# Patient Record
Sex: Female | Born: 1980 | Hispanic: Yes | Marital: Single | Smoking: Never smoker
Health system: Southern US, Community
[De-identification: ages and names within clinical notes are randomized; demographics above are authoritative.]

## PROBLEM LIST (undated history)

## (undated) DIAGNOSIS — N915 Oligomenorrhea, unspecified: Secondary | ICD-10-CM

## (undated) DIAGNOSIS — D649 Anemia, unspecified: Secondary | ICD-10-CM

## (undated) DIAGNOSIS — N920 Excessive and frequent menstruation with regular cycle: Secondary | ICD-10-CM

---

## 2008-01-04 ENCOUNTER — Emergency Department: Payer: Self-pay | Admitting: Emergency Medicine

## 2008-03-23 ENCOUNTER — Ambulatory Visit: Payer: Self-pay | Admitting: Family Medicine

## 2008-03-23 ENCOUNTER — Inpatient Hospital Stay: Payer: Self-pay

## 2008-10-29 ENCOUNTER — Ambulatory Visit: Payer: Self-pay | Admitting: Family Medicine

## 2019-03-17 ENCOUNTER — Other Ambulatory Visit: Payer: Self-pay | Admitting: General Practice

## 2019-03-17 DIAGNOSIS — Z20822 Contact with and (suspected) exposure to covid-19: Secondary | ICD-10-CM

## 2019-03-18 LAB — NOVEL CORONAVIRUS, NAA: SARS-CoV-2, NAA: DETECTED — AB

## 2020-01-13 ENCOUNTER — Other Ambulatory Visit: Payer: Self-pay

## 2020-01-13 ENCOUNTER — Emergency Department
Admission: EM | Admit: 2020-01-13 | Discharge: 2020-01-13 | Disposition: A | Discharge status: Home / Self Care | Payer: Self-pay | Attending: Emergency Medicine | Admitting: Emergency Medicine

## 2020-01-13 DIAGNOSIS — R531 Weakness: Secondary | ICD-10-CM | POA: Insufficient documentation

## 2020-01-13 DIAGNOSIS — D649 Anemia, unspecified: Secondary | ICD-10-CM | POA: Insufficient documentation

## 2020-01-13 HISTORY — DX: Excessive and frequent menstruation with regular cycle: N92.0

## 2020-01-13 HISTORY — DX: Anemia, unspecified: D64.9

## 2020-01-13 HISTORY — DX: Oligomenorrhea, unspecified: N91.5

## 2020-01-13 LAB — CBC
HCT: 22.6 % — ABNORMAL LOW (ref 36.0–46.0)
Hemoglobin: 6.6 g/dL — ABNORMAL LOW (ref 12.0–15.0)
MCH: 19 pg — ABNORMAL LOW (ref 26.0–34.0)
MCHC: 29.2 g/dL — ABNORMAL LOW (ref 30.0–36.0)
MCV: 64.9 fL — ABNORMAL LOW (ref 80.0–100.0)
Platelets: 364 10*3/uL (ref 150–400)
RBC: 3.48 MIL/uL — ABNORMAL LOW (ref 3.87–5.11)
RDW: 20.7 % — ABNORMAL HIGH (ref 11.5–15.5)
WBC: 5.2 10*3/uL (ref 4.0–10.5)
nRBC: 0 % (ref 0.0–0.2)

## 2020-01-13 LAB — TROPONIN I (HIGH SENSITIVITY)
Troponin I (High Sensitivity): <2 ng/L (ref <18)
Troponin I (High Sensitivity): 3 ng/L (ref <18)

## 2020-01-13 LAB — BASIC METABOLIC PANEL
Anion gap: 6 (ref 5–15)
BUN: 11 mg/dL (ref 6–20)
CO2: 26 mmol/L (ref 22–32)
Calcium: 8.7 mg/dL — ABNORMAL LOW (ref 8.9–10.3)
Chloride: 102 mmol/L (ref 98–111)
Creatinine, Ser: 0.42 mg/dL — ABNORMAL LOW (ref 0.44–1.00)
GFR calc non Af Amer: >60 mL/min (ref >60)
Glucose, Bld: 104 mg/dL — ABNORMAL HIGH (ref 70–99)
Potassium: 3.9 mmol/L (ref 3.5–5.1)
Sodium: 134 mmol/L — ABNORMAL LOW (ref 135–145)

## 2020-01-13 LAB — ABO/RH: ABO/RH(D): O POS

## 2020-01-13 LAB — POCT PREGNANCY, URINE: Preg Test, Ur: NEGATIVE

## 2020-01-13 LAB — PREPARE RBC (CROSSMATCH)

## 2020-01-13 LAB — GLUCOSE, CAPILLARY: Glucose-Capillary: 95 mg/dL (ref 70–99)

## 2020-01-13 MED ORDER — SODIUM CHLORIDE 0.9 % IV SOLN
10.0000 mL/h | Freq: Once | INTRAVENOUS | Status: AC
  Administered 2020-01-13: 10 mL/h via INTRAVENOUS

## 2020-01-13 NOTE — ED Notes (Signed)
Spanish interpretor requested

## 2020-01-13 NOTE — ED Triage Notes (Signed)
Pt to the er for weakness, dizziness, excessive bleeding with periods x 1.5 years. PCP sent pt here for HGB of 6.3. Pt has pain in her chest when she walks. Pt is dizzy in triage.

## 2020-01-13 NOTE — ED Provider Notes (Signed)
[facility] Emergency Department Provider Note  ____________________________________________   I have reviewed the triage vital signs and the nursing notes.   HISTORY  Chief Complaint Anemia  History limited by: Language [facility] Interpreter utilized   HPI Leah Glover is a 39 y.o. female who presents to the emergency department today as instructed by her physician because of concerns for anemia.  Patient states that she has a history of heavy and irregular menstrual periods.  She states she has a history of anemia and had been ordered for transfusion roughly 8 months ago but did not undergo that secondary to Covid concerns.  Patient states that for the past 3 weeks she has felt increasingly fatigued.  She states it is now hard for her to walk.  She states at the time my exam she is not currently menstruating or having any bleeding.  Records reviewed. Per medical record review patient has a history of anemia. Menorrhagia.   Past Medical History:  Diagnosis Date  . Anemia   . Menorrhagia   . Oligomenorrhea     There are no problems to display for this patient.   History reviewed. No pertinent surgical history.  Prior to Admission medications   Not on File    Allergies Patient has no known allergies.  No family history on file.  Social History Social History   Tobacco Use  . Smoking status: Never Smoker  . Smokeless tobacco: Never Used  Substance Use Topics  . Alcohol use: Never  . Drug use: Never    Review of Systems Constitutional: Positive for generalized weakness.  Eyes: No visual changes. ENT: No sore throat. Cardiovascular: Denies chest pain. Respiratory: Denies shortness of breath. Gastrointestinal: No abdominal pain.  No nausea, no vomiting.  No diarrhea.   Genitourinary: Positive for abnormal bleeding.  Musculoskeletal: Negative for back pain. Skin: Negative for rash. Neurological: Negative for headaches,  focal weakness or numbness.  ____________________________________________   PHYSICAL EXAM:  VITAL SIGNS: ED Triage Vitals  Enc Vitals Group     BP 01/13/20 1552 (!) 109/35     Pulse Rate 01/13/20 1552 79     Resp 01/13/20 1552 16     Temp 01/13/20 1552 98.4 F (36.9 C)     Temp Source 01/13/20 1552 Oral     SpO2 01/13/20 1552 100 %     Weight 01/13/20 1605 158 lb (71.7 kg)     Height 01/13/20 1605 5\' 2"  (1.575 m)     Head Circumference --      Peak Flow --      Pain Score 01/13/20 1559 7   Constitutional: Alert and oriented.  Eyes: Conjunctivae are normal.  ENT      Head: Normocephalic and atraumatic.      Nose: No congestion/rhinnorhea.      Mouth/Throat: Mucous membranes are moist.      Neck: No stridor. Hematological/Lymphatic/Immunilogical: No cervical lymphadenopathy. Cardiovascular: Normal rate, regular rhythm.  No murmurs, rubs, or gallops.  Respiratory: Normal respiratory effort without tachypnea nor retractions. Breath sounds are clear and equal bilaterally. No wheezes/rales/rhonchi. Gastrointestinal: Soft and non tender. No rebound. No guarding.  Genitourinary: Deferred Musculoskeletal: Normal range of motion in all extremities. No lower extremity edema. Neurologic:  Normal speech and language. No gross focal neurologic deficits are appreciated.  Skin:  Skin is warm, dry and intact. No rash noted. Psychiatric: Mood and affect are normal. Speech and behavior are normal. Patient exhibits appropriate insight and judgment.  ____________________________________________  LABS (pertinent positives/negatives)  BMP na 134, k 3.9, glu 104, cr 0.42 CBC wbc 5.2, hgb 6.6, plt 364 Trop hs <2  ____________________________________________   EKG  I, Phineas Semen, attending physician, personally viewed and interpreted this EKG  EKG Time: 1611 Rate: 68 Rhythm: normal sinus rhythm Axis: normal Intervals: qtc 406 QRS: narrow ST changes: no st  elevation Impression: Normal ekg   ____________________________________________    RADIOLOGY  None  ____________________________________________   PROCEDURES  Procedures  ____________________________________________   INITIAL IMPRESSION / ASSESSMENT AND PLAN / ED COURSE  Pertinent labs & imaging results that were available during my care of the patient were reviewed by me and considered in my medical decision making (see chart for details).   Patient presented to the emergency department today because of concern for anemia. Patient describes heavy menses and chronic history of anemia. Hgb 6.6 here. Patient denies any current bleeding. Patient was given one unit of pRBCs with improvement in her symptoms. Discussed with patient importance of follow up with ob/gyn and her pcp.   ___________________________________________   FINAL CLINICAL IMPRESSION(S) / ED DIAGNOSES  Final diagnoses:  Weakness  Anemia, unspecified type     Note: This dictation was prepared with Dragon dictation. Any transcriptional errors that result from this process are unintentional     Phineas Semen, MD 01/13/20 2302

## 2020-01-13 NOTE — ED Notes (Signed)
Pt resting, no complaints. Daughter at bedside.

## 2020-01-13 NOTE — Discharge Instructions (Addendum)
Please seek medical attention for any high fevers, chest pain, shortness of breath, change in behavior, persistent vomiting, bloody stool or any other new or concerning symptoms.  

## 2020-01-14 LAB — TYPE AND SCREEN
ABO/RH(D): O POS
Antibody Screen: NEGATIVE
Unit division: 0

## 2020-01-14 LAB — BPAM RBC
Blood Product Expiration Date: 202110312359
ISSUE DATE / TIME: 202110061917
Unit Type and Rh: 5100

## 2020-04-15 ENCOUNTER — Encounter: Payer: Self-pay | Admitting: Emergency Medicine

## 2020-04-15 ENCOUNTER — Emergency Department
Admission: EM | Admit: 2020-04-15 | Discharge: 2020-04-15 | Disposition: A | Discharge status: Home / Self Care | Payer: Self-pay | Attending: Emergency Medicine | Admitting: Emergency Medicine

## 2020-04-15 ENCOUNTER — Other Ambulatory Visit: Payer: Self-pay

## 2020-04-15 ENCOUNTER — Emergency Department: Payer: Self-pay

## 2020-04-15 DIAGNOSIS — N939 Abnormal uterine and vaginal bleeding, unspecified: Secondary | ICD-10-CM | POA: Insufficient documentation

## 2020-04-15 DIAGNOSIS — R102 Pelvic and perineal pain: Secondary | ICD-10-CM | POA: Insufficient documentation

## 2020-04-15 DIAGNOSIS — Z8742 Personal history of other diseases of the female genital tract: Secondary | ICD-10-CM | POA: Insufficient documentation

## 2020-04-15 LAB — URINALYSIS, COMPLETE (UACMP) WITH MICROSCOPIC
Bacteria, UA: NONE SEEN
Bilirubin Urine: NEGATIVE
Glucose, UA: NEGATIVE mg/dL
Ketones, ur: NEGATIVE mg/dL
Leukocytes,Ua: NEGATIVE
Nitrite: NEGATIVE
Protein, ur: 30 mg/dL — AB
RBC / HPF: >50 RBC/hpf — ABNORMAL HIGH (ref 0–5)
Specific Gravity, Urine: 1.019 (ref 1.005–1.030)
Squamous Epithelial / HPF: NONE SEEN (ref 0–5)
pH: 5 (ref 5.0–8.0)

## 2020-04-15 LAB — HCG, QUANTITATIVE, PREGNANCY: hCG, Beta Chain, Quant, S: 1 m[IU]/mL (ref <5)

## 2020-04-15 LAB — COMPREHENSIVE METABOLIC PANEL
ALT: 26 U/L (ref 0–44)
AST: 28 U/L (ref 15–41)
Albumin: 4.1 g/dL (ref 3.5–5.0)
Alkaline Phosphatase: 102 U/L (ref 38–126)
Anion gap: 6 (ref 5–15)
BUN: 14 mg/dL (ref 6–20)
CO2: 23 mmol/L (ref 22–32)
Calcium: 9.4 mg/dL (ref 8.9–10.3)
Chloride: 108 mmol/L (ref 98–111)
Creatinine, Ser: 0.55 mg/dL (ref 0.44–1.00)
GFR, Estimated: >60 mL/min (ref >60)
Glucose, Bld: 129 mg/dL — ABNORMAL HIGH (ref 70–99)
Potassium: 3.9 mmol/L (ref 3.5–5.1)
Sodium: 137 mmol/L (ref 135–145)
Total Bilirubin: 0.5 mg/dL (ref 0.3–1.2)
Total Protein: 8.7 g/dL — ABNORMAL HIGH (ref 6.5–8.1)

## 2020-04-15 LAB — CBC WITH DIFFERENTIAL/PLATELET
Abs Immature Granulocytes: 0.03 10*3/uL (ref 0.00–0.07)
Basophils Absolute: 0 10*3/uL (ref 0.0–0.1)
Basophils Relative: 0 %
Eosinophils Absolute: 0 10*3/uL (ref 0.0–0.5)
Eosinophils Relative: 0 %
HCT: 31.2 % — ABNORMAL LOW (ref 36.0–46.0)
Hemoglobin: 9.2 g/dL — ABNORMAL LOW (ref 12.0–15.0)
Immature Granulocytes: 0 %
Lymphocytes Relative: 9 %
Lymphs Abs: 1 10*3/uL (ref 0.7–4.0)
MCH: 20.4 pg — ABNORMAL LOW (ref 26.0–34.0)
MCHC: 29.5 g/dL — ABNORMAL LOW (ref 30.0–36.0)
MCV: 69.2 fL — ABNORMAL LOW (ref 80.0–100.0)
Monocytes Absolute: 0.6 10*3/uL (ref 0.1–1.0)
Monocytes Relative: 5 %
Neutro Abs: 9.5 10*3/uL — ABNORMAL HIGH (ref 1.7–7.7)
Neutrophils Relative %: 86 %
Platelets: 350 10*3/uL (ref 150–400)
RBC: 4.51 MIL/uL (ref 3.87–5.11)
RDW: 25.4 % — ABNORMAL HIGH (ref 11.5–15.5)
Smear Review: NORMAL
WBC: 11.2 10*3/uL — ABNORMAL HIGH (ref 4.0–10.5)
nRBC: 0 % (ref 0.0–0.2)

## 2020-04-15 LAB — PROTIME-INR
INR: 1 (ref 0.8–1.2)
Prothrombin Time: 12.6 seconds (ref 11.4–15.2)

## 2020-04-15 LAB — TYPE AND SCREEN
ABO/RH(D): O POS
Antibody Screen: NEGATIVE

## 2020-04-15 MED ORDER — FENTANYL CITRATE (PF) 100 MCG/2ML IJ SOLN
50.0000 ug | Freq: Once | INTRAMUSCULAR | Status: AC
  Administered 2020-04-15: 50 ug via INTRAVENOUS
  Filled 2020-04-15: qty 2

## 2020-04-15 MED ORDER — MEDROXYPROGESTERONE ACETATE 10 MG PO TABS: 20.0000 mg | ORAL_TABLET | Freq: Three times a day (TID) | ORAL | 0 refills | Status: DC

## 2020-04-15 MED ORDER — ACETAMINOPHEN 500 MG PO TABS
1000.0000 mg | ORAL_TABLET | Freq: Once | ORAL | Status: AC
  Administered 2020-04-15: 1000 mg via ORAL

## 2020-04-15 NOTE — Discharge Instructions (Addendum)
Results for orders placed or performed during the hospital encounter of 04/15/20  CBC with Differential  Result Value Ref Range   WBC 11.2 (H) 4.0 - 10.5 K/uL   RBC 4.51 3.87 - 5.11 MIL/uL   Hemoglobin 9.2 (L) 12.0 - 15.0 g/dL   HCT 00.8 (L) 67.6 - 19.5 %   MCV 69.2 (L) 80.0 - 100.0 fL   MCH 20.4 (L) 26.0 - 34.0 pg   MCHC 29.5 (L) 30.0 - 36.0 g/dL   RDW 09.3 (H) 26.7 - 12.4 %   Platelets 350 150 - 400 K/uL   nRBC 0.0 0.0 - 0.2 %   Neutrophils Relative % 86 %   Neutro Abs 9.5 (H) 1.7 - 7.7 K/uL   Lymphocytes Relative 9 %   Lymphs Abs 1.0 0.7 - 4.0 K/uL   Monocytes Relative 5 %   Monocytes Absolute 0.6 0.1 - 1.0 K/uL   Eosinophils Relative 0 %   Eosinophils Absolute 0.0 0.0 - 0.5 K/uL   Basophils Relative 0 %   Basophils Absolute 0.0 0.0 - 0.1 K/uL   WBC Morphology MORPHOLOGY UNREMARKABLE    Smear Review Normal platelet morphology    Immature Granulocytes 0 %   Abs Immature Granulocytes 0.03 0.00 - 0.07 K/uL   Polychromasia PRESENT   Comprehensive metabolic panel  Result Value Ref Range   Sodium 137 135 - 145 mmol/L   Potassium 3.9 3.5 - 5.1 mmol/L   Chloride 108 98 - 111 mmol/L   CO2 23 22 - 32 mmol/L   Glucose, Bld 129 (H) 70 - 99 mg/dL   BUN 14 6 - 20 mg/dL   Creatinine, Ser 5.80 0.44 - 1.00 mg/dL   Calcium 9.4 8.9 - 99.8 mg/dL   Total Protein 8.7 (H) 6.5 - 8.1 g/dL   Albumin 4.1 3.5 - 5.0 g/dL   AST 28 15 - 41 U/L   ALT 26 0 - 44 U/L   Alkaline Phosphatase 102 38 - 126 U/L   Total Bilirubin 0.5 0.3 - 1.2 mg/dL   GFR, Estimated >33 >82 mL/min   Anion gap 6 5 - 15  Urinalysis, Complete w Microscopic  Result Value Ref Range   Color, Urine YELLOW (A) YELLOW   APPearance CLOUDY (A) CLEAR   Specific Gravity, Urine 1.019 1.005 - 1.030   pH 5.0 5.0 - 8.0   Glucose, UA NEGATIVE NEGATIVE mg/dL   Hgb urine dipstick LARGE (A) NEGATIVE   Bilirubin Urine NEGATIVE NEGATIVE   Ketones, ur NEGATIVE NEGATIVE mg/dL   Protein, ur 30 (A) NEGATIVE mg/dL   Nitrite NEGATIVE NEGATIVE    Leukocytes,Ua NEGATIVE NEGATIVE   RBC / HPF >50 (H) 0 - 5 RBC/hpf   WBC, UA 11-20 0 - 5 WBC/hpf   Bacteria, UA NONE SEEN NONE SEEN   Squamous Epithelial / LPF NONE SEEN 0 - 5   Mucus PRESENT   Protime-INR  Result Value Ref Range   Prothrombin Time 12.6 11.4 - 15.2 seconds   INR 1.0 0.8 - 1.2  hCG, quantitative, pregnancy  Result Value Ref Range   hCG, Beta Chain, Quant, S 1 <5 mIU/mL  Type and screen [facility]  Result Value Ref Range   ABO/RH(D) O POS    Antibody Screen NEG    Sample Expiration      04/18/2020,2359 Performed at [facility], [address] [place], [place] [address]    US PELVIC COMPLETE W TRANSVAGINAL AND TORSION R/O  Result Date: 04/15/2020 CLINICAL DATA:  Pelvic pain. Vaginal bleeding for the past 2 weeks. Scheduled for surgery later this month for an endometrial polyp. EXAM: TRANSABDOMINAL AND TRANSVAGINAL ULTRASOUND OF PELVIS DOPPLER ULTRASOUND OF OVARIES TECHNIQUE: Both transabdominal and transvaginal ultrasound examinations of the pelvis were performed. Transabdominal technique was performed for global imaging of the pelvis including uterus, ovaries, adnexal regions, and pelvic cul-de-sac. It was necessary to proceed with endovaginal exam following the transabdominal exam to visualize the ovaries. Color and duplex Doppler ultrasound was utilized to evaluate blood flow to the ovaries. COMPARISON:  OB ultrasound dated 03/23/2008. FINDINGS: Uterus Measurements: 12.2 x 9.0 x 7.5 cm = volume: 431 mL. No fibroids or other mass visualized. Endometrium Thickness: 33.9 mm. Diffusely thickened and heterogeneous with a poorly defined eccentric mass posteriorly, measuring approximately 3.4 x 3.3 x 3.2 cm. This has mildly prominent internal blood flow with color Doppler. Right ovary Not visualized Left ovary Measurements: 2.9 x 2.1 x 2.1 cm = volume: 6.9 mL. Normal appearance/no adnexal mass. Pulsed Doppler evaluation of the left ovary  demonstrates normal low-resistance arterial and venous waveforms. Other findings No abnormal free fluid. IMPRESSION: 1. Approximately 3.4 x 3.3 x 3.2 cm eccentric mass in the endometrium posteriorly, most likely representing the patient's known endometrial polyp. 2. Nonvisualized right ovary. 3. Normal appearing left ovary without evidence of torsion. Electronically Signed   By: Beckie Salts M.D.   On: 04/15/2020 12:16

## 2020-04-15 NOTE — ED Notes (Signed)
Report to John, RN

## 2020-04-15 NOTE — ED Provider Notes (Signed)
[facility] Emergency Department Provider Note  ____________________________________________   Event Date/Time   First MD Initiated Contact with Patient 04/15/20 [phone]     (approximate)  I have reviewed the triage vital signs and the nursing notes.   HISTORY  Chief Complaint Vaginal Bleeding   HPI Leah Glover is a 40 y.o. female G3P3 with a past medical history of menorrhagia and known uterine polyps who presents for assessment of acute onset of bilateral lower pelvic pain that started around 4 AM today.  Patient states she has been spotting the last couple days but is not bleeding anymore than usual.  He states she has had bleeding with her polyps in the past but never this type of pain.  She denies any other acute sick symptoms including other vaginal discharge, burning with urination nausea, vomiting, diarrhea, back pain, chest pain, cough, shortness of breath, or other acute sick symptoms.  No prior similar episodes.  No clear alleviating aggravating factors.         Past Medical History:  Diagnosis Date  . Anemia   . Menorrhagia   . Oligomenorrhea     There are no problems to display for this patient.   History reviewed. No pertinent surgical history.  Prior to Admission medications   Not on File    Allergies Patient has no known allergies.  No family history on file.  Social History Social History   Tobacco Use  . Smoking status: Never Smoker  . Smokeless tobacco: Never Used  Vaping Use  . Vaping Use: Never used  Substance Use Topics  . Alcohol use: Never  . Drug use: Never    Review of Systems  Review of Systems  Constitutional: Negative for chills and fever.  HENT: Negative for sore throat.   Eyes: Negative for pain.  Respiratory: Negative for cough and stridor.   Cardiovascular: Negative for chest pain.  Gastrointestinal: Positive for abdominal pain. Negative for vomiting.  Genitourinary: Negative for dysuria.   Musculoskeletal: Negative for myalgias.  Skin: Negative for rash.  Neurological: Negative for seizures, loss of consciousness and headaches.  Psychiatric/Behavioral: Negative for suicidal ideas.  All other systems reviewed and are negative.     ____________________________________________   PHYSICAL EXAM:  VITAL SIGNS: ED Triage Vitals  Enc Vitals Group     BP 04/15/20 0534 128/90     Pulse Rate 04/15/20 0534 61     Resp 04/15/20 0534 20     Temp 04/15/20 0534 98.4 F (36.9 C)     Temp Source 04/15/20 0534 Oral     SpO2 04/15/20 0534 100 %     Weight 04/15/20 0535 160 lb (72.6 kg)     Height 04/15/20 0535 5\' 2"  (1.575 m)     Head Circumference --      Peak Flow --      Pain Score 04/15/20 0539 10     Pain Loc --      Pain Edu? --      Excl. in GC? --    Vitals:   04/15/20 0534  BP: 128/90  Pulse: 61  Resp: 20  Temp: 98.4 F (36.9 C)  SpO2: 100%   Physical Exam Vitals and nursing note reviewed. Exam conducted with a chaperone present.  Constitutional:      General: She is in acute distress.     Appearance: She is well-developed and well-nourished.  HENT:     Head: Normocephalic and atraumatic.     Right  Ear: External ear normal.     Left Ear: External ear normal.     Nose: Nose normal.  Eyes:     Conjunctiva/sclera: Conjunctivae normal.  Cardiovascular:     Rate and Rhythm: Normal rate and regular rhythm.     Heart sounds: No murmur heard.   Pulmonary:     Effort: Pulmonary effort is normal. No respiratory distress.     Breath sounds: Normal breath sounds.  Abdominal:     Palpations: Abdomen is soft.     Tenderness: There is no abdominal tenderness.  Genitourinary:    Cervix: Cervical bleeding ( from closed external os) present. No cervical motion tenderness, friability or lesion.  Musculoskeletal:        General: No edema.     Cervical back: Neck supple.  Skin:    General: Skin is warm and dry.  Neurological:     Mental Status: She is alert  and oriented to person, place, and time.  Psychiatric:        Mood and Affect: Mood and affect and mood normal.      ____________________________________________   LABS (all labs ordered are listed, but only abnormal results are displayed)  Labs Reviewed  CBC WITH DIFFERENTIAL/PLATELET  COMPREHENSIVE METABOLIC PANEL  URINALYSIS, COMPLETE (UACMP) WITH MICROSCOPIC  PROTIME-INR  POC URINE PREG, ED  TYPE AND SCREEN   ____________________________________________  ____________________________________________  RADIOLOGY  ED MD interpretation:   Official radiology report(s): No results found.  ____________________________________________   PROCEDURES  Procedure(s) performed (including Critical Care):  Procedures   ____________________________________________   INITIAL IMPRESSION / ASSESSMENT AND PLAN / ED COURSE      Patient presents with above to history exam for assessment of acute onset of pelvic pain associated with passage of some tissue from her vagina and vaginal bleeding in the setting of several days of spotting and history of metrorrhagia and anemia requiring transfusions.  On arrival patient is afebrile hemodynamically stable.  On exam she does have closed os with some scant bleeding but does have what appears to be endometrial tissue in her underwear.  She does appear to be in acute distress and below noted analgesia was given.  Differential includes but is not limited to sloughing of endometrial tissue possibly containing polyp versus other etiologies of dysfunctional uterine bleeding.  Given passage of tissue and bleeding and lower suspicion for torsion although we will also assess for ovarian size and blood flow with pelvic ultrasound.  We will check basic labs and urine as well.  No findings on exam to suggest PID.  No GI symptoms to suggest diverticulitis and has no tenderness above the umbilicus or other historical or exam findings suggest acute  cholecystitis, pancreatitis, or appendicitis.    Care patient signed over to oncoming provider at approximately 0 700.  Plan is to follow-up labs and ultrasound.  If these are normal and patient continues have normal vital signs and appears to be more comfortable on reassessment of late she is really safe for discharge with plan for close outpatient gynecology follow-up.        ____________________________________________   FINAL CLINICAL IMPRESSION(S) / ED DIAGNOSES  Final diagnoses:  Pelvic pain    Medications  acetaminophen (TYLENOL) tablet 1,000 mg (has no administration in time range)  fentaNYL (SUBLIMAZE) injection 50 mcg (has no administration in time range)     ED Discharge Orders    None       Note:  This document was prepared using Dragon voice  recognition software and may include unintentional dictation errors.   Gilles Chiquito, MD 04/15/20 [phone]

## 2020-04-15 NOTE — ED Triage Notes (Addendum)
Pt to triage via w/c, appears very uncomfortable, pale; husband st pt is sched for surgery later this month for uterine polyps but pt awoke PTA with severe pain, bleeding, and swelling

## 2020-04-15 NOTE — ED Notes (Signed)
Pt resting quietly at this time. NAD noted. Husband at bedside. Call bell in reach. Pt denies any further needs at this time.

## 2020-04-15 NOTE — ED Provider Notes (Signed)
Procedures     ----------------------------------------- 2:43 PM on 04/15/2020 -----------------------------------------  Vital signs remained stable.  Pain is decreased to 3/10, intermittent.  Ultrasound shows no acute findings, redemonstrates known uterine polyp.  She reports that she has been following up with her PCP but not gynecology yet despite having irregular bleeding for the past 2 years.  Discussed findings with the patient and her spouse at bedside, recommend follow-up with gynecology, course of Provera tablets for menorrhagia.    Sharman Cheek, MD 04/15/20 1444

## 2020-04-15 NOTE — ED Notes (Signed)
Pt assisted to bedside toilet and educated on need for UA. Pt and family educated for the need to move pt to a hallway, pt educated.

## 2020-04-15 NOTE — ED Notes (Addendum)
This RN went to move pt to hallway bed and husband started to argue with this RN that serum is not blood and that it was something that she needed through her IV, husband then started to state he didn't like my attitude and that I was wrong that "serum was NOT blood and it was needed through her IV", pt educated this was not true. 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 Pt and family updated on plan of care, husband still proceeded to argue with this RN. Charge aware.

## 2020-05-04 DIAGNOSIS — N939 Abnormal uterine and vaginal bleeding, unspecified: Secondary | ICD-10-CM | POA: Insufficient documentation

## 2021-10-29 IMAGING — US US PELVIS COMPLETE TRANSABD/TRANSVAG W DUPLEX
1 series · 13 of 25 positions shown · non-contrast
Comparison: OB ultrasound dated 03/23/2008.

CLINICAL DATA: Pelvic pain. Vaginal bleeding for the past 2 weeks.
Scheduled for surgery later this month for an endometrial polyp.

EXAM:
TRANSABDOMINAL AND TRANSVAGINAL ULTRASOUND OF PELVIS
DOPPLER ULTRASOUND OF OVARIES
TECHNIQUE: Both transabdominal and transvaginal ultrasound examinations of the
pelvis were performed. Transabdominal technique was performed for
global imaging of the pelvis including uterus, ovaries, adnexal
regions, and pelvic cul-de-sac.
It was necessary to proceed with endovaginal exam following the
transabdominal exam to visualize the ovaries. Color and duplex
Doppler ultrasound was utilized to evaluate blood flow to the
ovaries.

[Series 1: us pelvic complete w transvaginal and torsion righ · 13 of 96 slices shown]
[im 1/96]
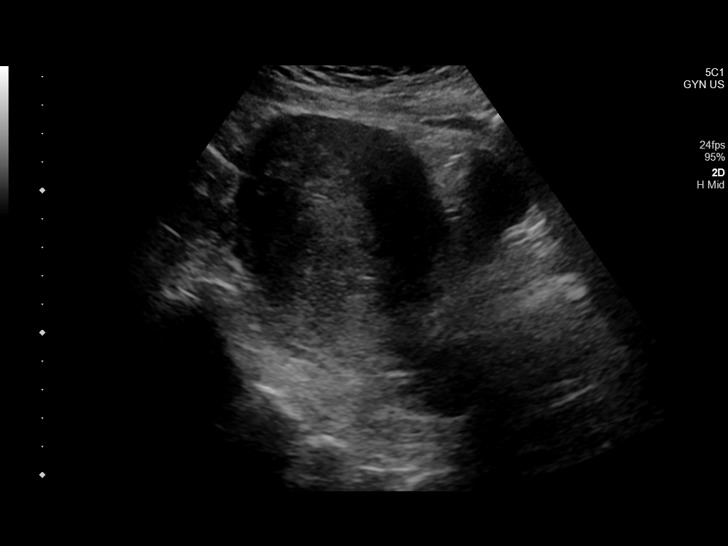
[im 8/96]
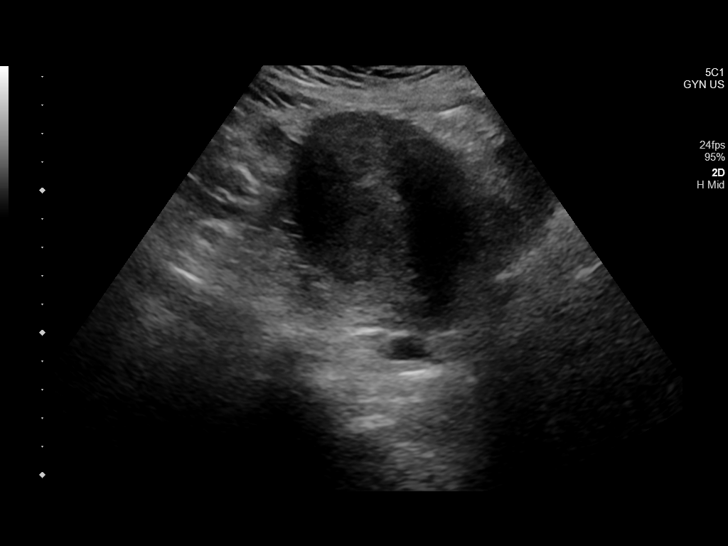
[im 16/96]
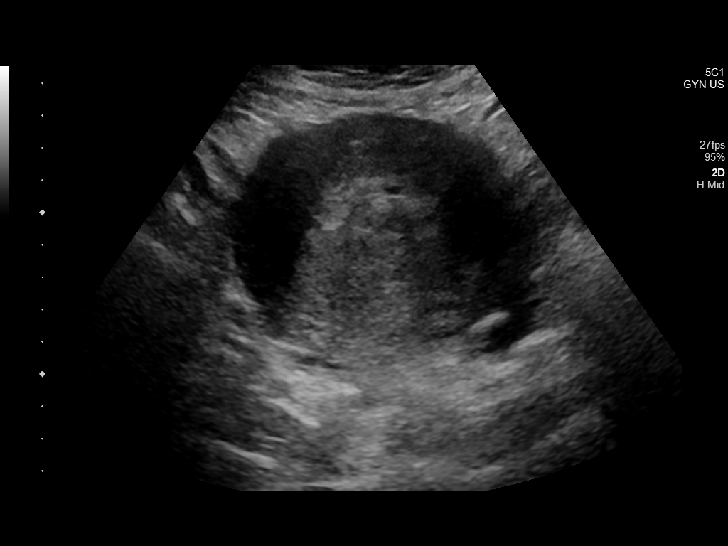
[im 24/96]
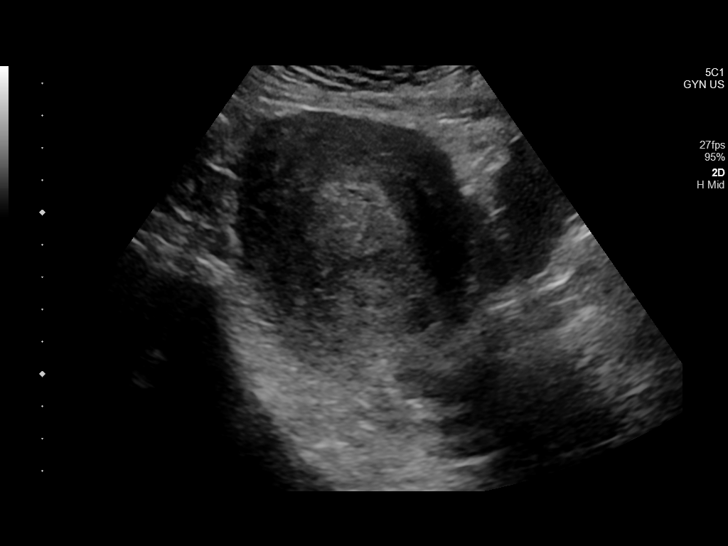
[im 32/96]
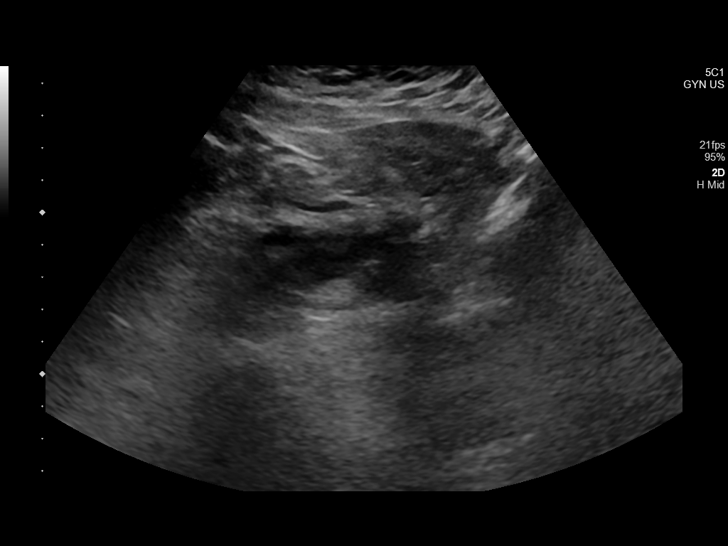
[im 40/96]
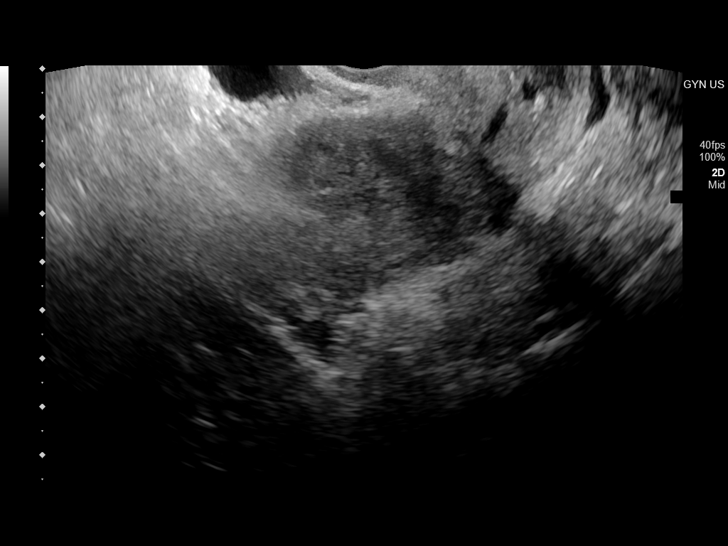
[im 48/96]
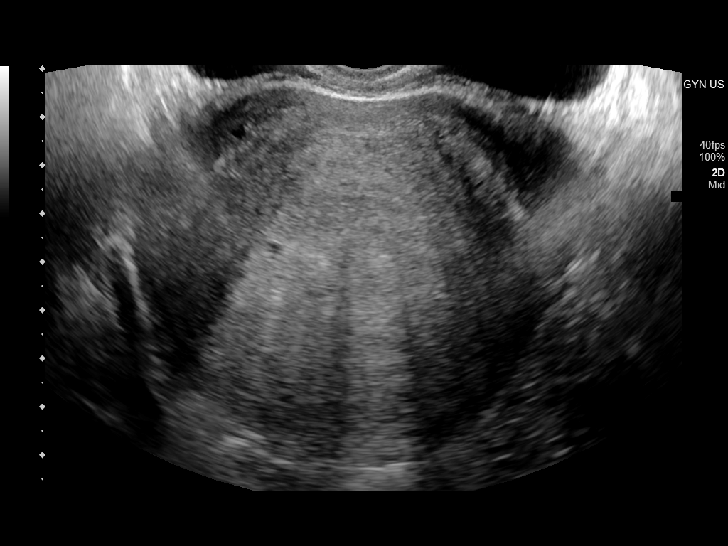
[im 56/96]
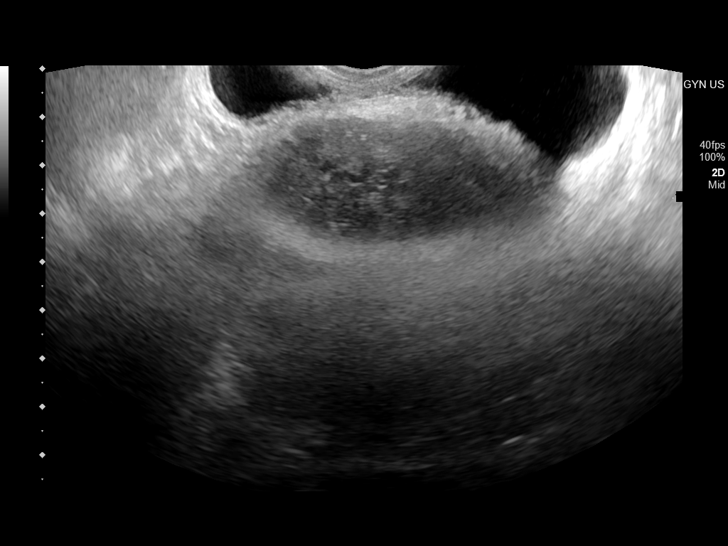
[im 64/96]
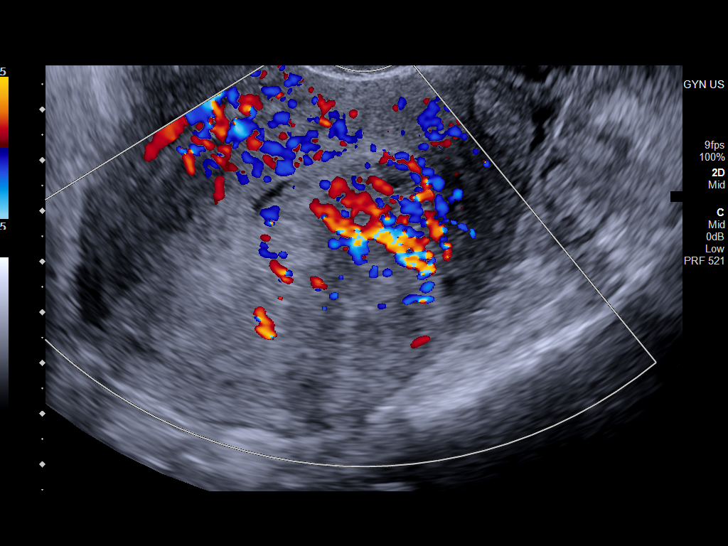
[im 72/96]
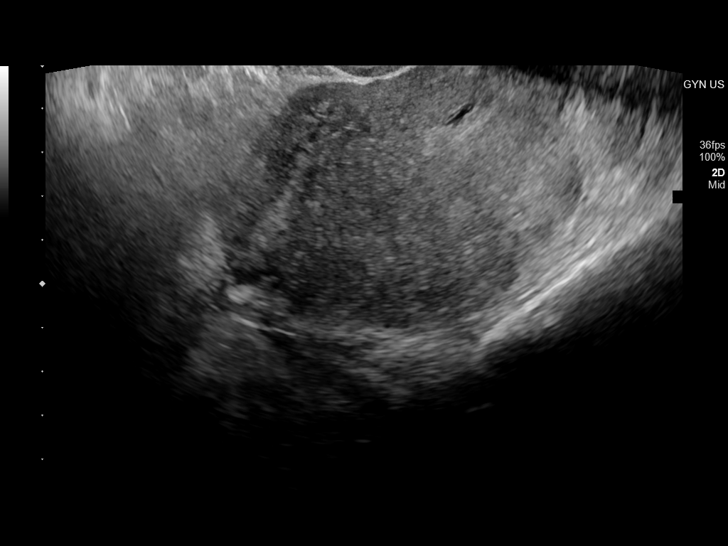
[im 80/96]
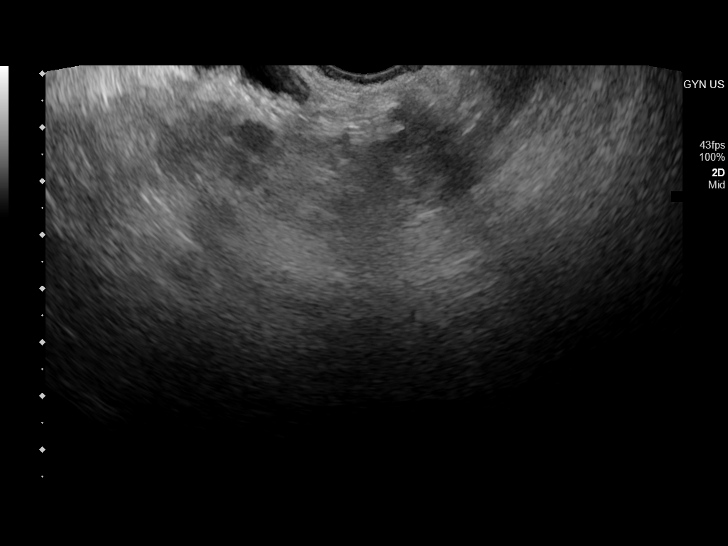
[im 88/96]
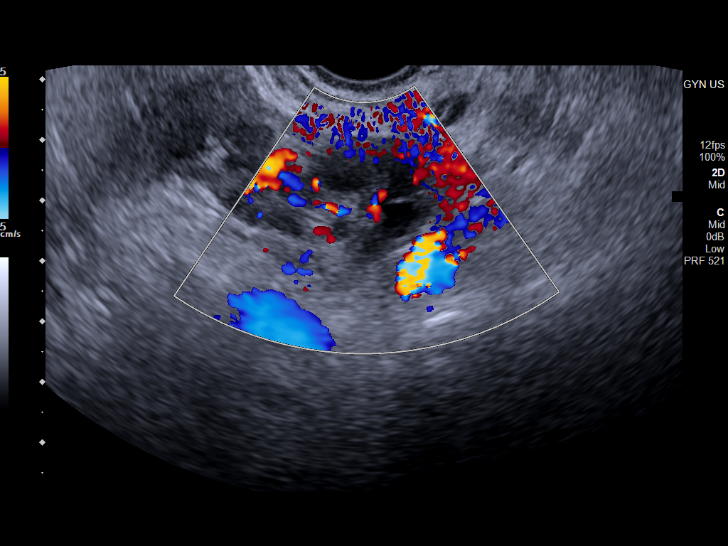
[im 96/96]
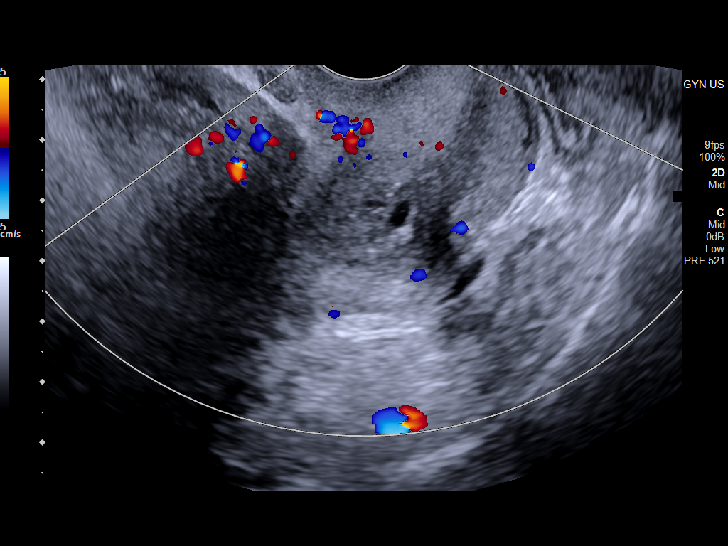

[13 of 25 positions shown; findings below may reference images not displayed]

FINDINGS: Uterus

Measurements: 12.2 x 9.0 x 7.5 cm = volume: 431 mL. No fibroids or
other mass visualized.

Endometrium

Thickness: 33.9 mm. Diffusely thickened and heterogeneous with a
poorly defined eccentric mass posteriorly, measuring approximately
3.4 x 3.3 x 3.2 cm. This has mildly prominent internal blood flow
with color Doppler.

Right ovary

Not visualized

Left ovary

Measurements: 2.9 x 2.1 x 2.1 cm = volume: 6.9 mL. Normal
appearance/no adnexal mass.

Pulsed Doppler evaluation of the left ovary demonstrates normal
low-resistance arterial and venous waveforms.

Other findings

No abnormal free fluid.
IMPRESSION: 1. Approximately 3.4 x 3.3 x 3.2 cm eccentric mass in the
endometrium posteriorly, most likely representing the patient's
known endometrial polyp.
2. Nonvisualized right ovary.
3. Normal appearing left ovary without evidence of torsion.

## 2022-07-21 ENCOUNTER — Observation Stay
Admission: EM | Admit: 2022-07-21 | Discharge: 2022-07-22 | Disposition: A | Discharge status: Home / Self Care | Payer: Self-pay | Attending: Obstetrics | Admitting: Obstetrics

## 2022-07-21 ENCOUNTER — Emergency Department: Payer: Self-pay

## 2022-07-21 ENCOUNTER — Other Ambulatory Visit: Payer: Self-pay

## 2022-07-21 DIAGNOSIS — D500 Iron deficiency anemia secondary to blood loss (chronic): Secondary | ICD-10-CM | POA: Insufficient documentation

## 2022-07-21 DIAGNOSIS — D649 Anemia, unspecified: Secondary | ICD-10-CM

## 2022-07-21 DIAGNOSIS — N920 Excessive and frequent menstruation with regular cycle: Secondary | ICD-10-CM

## 2022-07-21 DIAGNOSIS — Z23 Encounter for immunization: Secondary | ICD-10-CM | POA: Insufficient documentation

## 2022-07-21 DIAGNOSIS — N938 Other specified abnormal uterine and vaginal bleeding: Secondary | ICD-10-CM

## 2022-07-21 DIAGNOSIS — N939 Abnormal uterine and vaginal bleeding, unspecified: Principal | ICD-10-CM | POA: Insufficient documentation

## 2022-07-21 DIAGNOSIS — R7309 Other abnormal glucose: Secondary | ICD-10-CM | POA: Insufficient documentation

## 2022-07-21 DIAGNOSIS — D62 Acute posthemorrhagic anemia: Secondary | ICD-10-CM | POA: Insufficient documentation

## 2022-07-21 DIAGNOSIS — Z1152 Encounter for screening for COVID-19: Secondary | ICD-10-CM | POA: Insufficient documentation

## 2022-07-21 LAB — CBC
HCT: 25.4 % — ABNORMAL LOW (ref 36.0–46.0)
Hemoglobin: 6.5 g/dL — ABNORMAL LOW (ref 12.0–15.0)
MCH: 15.4 pg — ABNORMAL LOW (ref 26.0–34.0)
MCHC: 25.6 g/dL — ABNORMAL LOW (ref 30.0–36.0)
MCV: 60 fL — ABNORMAL LOW (ref 80.0–100.0)
Platelets: 459 10*3/uL — ABNORMAL HIGH (ref 150–400)
RBC: 4.23 MIL/uL (ref 3.87–5.11)
RDW: 22.2 % — ABNORMAL HIGH (ref 11.5–15.5)
WBC: 5.8 10*3/uL (ref 4.0–10.5)
nRBC: 0 % (ref 0.0–0.2)

## 2022-07-21 LAB — RESP PANEL BY RT-PCR (RSV, FLU A&B, COVID)  RVPGX2
Influenza A by PCR: NEGATIVE
Influenza B by PCR: NEGATIVE
Resp Syncytial Virus by PCR: NEGATIVE
SARS Coronavirus 2 by RT PCR: NEGATIVE

## 2022-07-21 LAB — BASIC METABOLIC PANEL
Anion gap: 4 — ABNORMAL LOW (ref 5–15)
BUN: 16 mg/dL (ref 6–20)
CO2: 25 mmol/L (ref 22–32)
Calcium: 8.4 mg/dL — ABNORMAL LOW (ref 8.9–10.3)
Chloride: 103 mmol/L (ref 98–111)
Creatinine, Ser: 0.47 mg/dL (ref 0.44–1.00)
GFR, Estimated: >60 mL/min (ref >60)
Glucose, Bld: 118 mg/dL — ABNORMAL HIGH (ref 70–99)
Potassium: 3.5 mmol/L (ref 3.5–5.1)
Sodium: 132 mmol/L — ABNORMAL LOW (ref 135–145)

## 2022-07-21 LAB — TYPE AND SCREEN
ABO/RH(D): O POS
Antibody Screen: NEGATIVE
Unit division: 0

## 2022-07-21 LAB — BPAM RBC
Blood Product Expiration Date: 202405202359
ISSUE DATE / TIME: 202404132009
Unit Type and Rh: 5100

## 2022-07-21 LAB — HCG, QUANTITATIVE, PREGNANCY: hCG, Beta Chain, Quant, S: <1 m[IU]/mL (ref <5)

## 2022-07-21 LAB — PREPARE RBC (CROSSMATCH)

## 2022-07-21 LAB — CBG MONITORING, ED: Glucose-Capillary: 110 mg/dL — ABNORMAL HIGH (ref 70–99)

## 2022-07-21 MED ORDER — OXYCODONE-ACETAMINOPHEN 5-325 MG PO TABS
1.0000 | ORAL_TABLET | Freq: Once | ORAL | Status: AC
  Administered 2022-07-21: 1 via ORAL
  Filled 2022-07-21: qty 1

## 2022-07-21 MED ORDER — BENZONATATE 100 MG PO CAPS
200.0000 mg | ORAL_CAPSULE | Freq: Once | ORAL | Status: AC
  Administered 2022-07-21: 200 mg via ORAL
  Filled 2022-07-21: qty 2

## 2022-07-21 MED ORDER — SODIUM CHLORIDE 0.9 % IV SOLN
10.0000 mL/h | Freq: Once | INTRAVENOUS | Status: AC
  Administered 2022-07-21: 10 mL/h via INTRAVENOUS

## 2022-07-21 MED ORDER — NORETHINDRONE ACETATE 5 MG PO TABS: 5.0000 mg | ORAL_TABLET | Freq: Two times a day (BID) | ORAL | 0 refills | Status: AC

## 2022-07-21 MED ORDER — ONDANSETRON 4 MG PO TBDP
4.0000 mg | ORAL_TABLET | Freq: Once | ORAL | Status: AC
  Administered 2022-07-21: 4 mg via ORAL
  Filled 2022-07-21: qty 1

## 2022-07-21 NOTE — Consult Note (Signed)
Consult History and Physical   SERVICE:   Patient Name: Leah Glover Patient MRN:   [id number]  CC: Heavy menstrual bleeding  HPI: Leah Glover is a 42 y.o. [id number] with symptomatic anemia. She reports that for the past few years, her menstrual bleeding has increased significantly. Her bleeding lasts 1-2 weeks and she has to change her pad 8-10x a day. She has quarter-sized clots. She reports 3 uncomplicated pregnancies with vaginal births. She denies a h/o of fibroids, uterine masses, and uterine surgery. She reports no known family h/o uterine cancer. Menarche was at age 17.   Review of Systems GEN:  +fatigue, dry mouth PULM:  +SOB GU:  + heavy menstrual bleeding  Past Obstetrical History: OB History   No obstetric history on file.     Past Gynecologic History: LMP about 2 weeks ago. Menstrual frequency: Q 4 wks lasting 7-14 days requiring 8-10 pads/day, positive for night time symptoms  Past Medical History: Past Medical History:  Diagnosis Date   Anemia    Menorrhagia    Oligomenorrhea     Past Surgical History:  No past surgical history on file.  Family History:  family history is not on file.  Social History:  Social History   Socioeconomic History   Marital status: Single    Spouse name: Not on file   Number of children: Not on file   Years of education: Not on file   Highest education level: Not on file  Occupational History   Not on file  Tobacco Use   Smoking status: Never   Smokeless tobacco: Never  Vaping Use   Vaping Use: Never used  Substance and Sexual Activity   Alcohol use: Never   Drug use: Never   Sexual activity: Not on file  Other Topics Concern   Not on file  Social History Narrative   Not on file   Social Determinants of Health   Financial Resource Strain: Not on file  Food Insecurity: Not on file  Transportation Needs: Not on file  Physical Activity: Not on file  Stress: Not on file  Social Connections: Not  on file  Intimate Partner Violence: Not on file    Home Medications:  Medications reconciled in EPIC  No current facility-administered medications on file prior to encounter.   No current outpatient medications on file prior to encounter.    Allergies:  No Known Allergies  Physical Exam:  Temp:  [98.2 F (36.8 C)-98.5 F (36.9 C)] 98.3 F (36.8 C) (04/13 1640) Pulse Rate:  [62-65] 62 (04/13 1640) Resp:  [18-20] 20 (04/13 1640) BP: (94-124)/(65-71) 94/65 (04/13 1640) SpO2:  [100 %] 100 % (04/13 1640) Weight:  [72.6 kg] 72.6 kg (04/13 1339)   General Appearance:  Well developed, well nourished, no acute distress, alert and oriented x3 HEENT:  Normocephalic atraumatic, extraocular movements intact, moist mucous membranes Cardiovascular:  Normal S1/S2, regular rate and rhythm Pulmonary:  clear to auscultation, no wheezes, rales or rhonchi, symmetric air entry, good air exchange Abdomen:  Bowel sounds present, soft, nontender, nondistended, no abnormal masses, no epigastric pain Skin:  normal coloration and turgor, no rashes, no suspicious skin lesions noted  Psychiatric:  Normal mood and affect, appropriate, no AH/VH Pelvic:  NEFG, no vulvar masses or lesions, small amount of bright red vaginal bleeding, uterus ~14 weeks size on bimanual exam, mildly tender to palpation   Labs/Studies:   CBC and Coags:  Lab Results  Component Value Date   WBC 5.8 07/21/2022  NEUTOPHILPCT 86 04/15/2020   EOSPCT 0 04/15/2020   BASOPCT 0 04/15/2020   LYMPHOPCT 9 04/15/2020   HGB 6.5 (L) 07/21/2022   HCT 25.4 (L) 07/21/2022   MCV 60.0 (L) 07/21/2022   PLT 459 (H) 07/21/2022   INR 1.0 04/15/2020   CMP:  Lab Results  Component Value Date   NA 132 (L) 07/21/2022   K 3.5 07/21/2022   CL 103 07/21/2022   CO2 25 07/21/2022   BUN 16 07/21/2022   CREATININE 0.47 07/21/2022   CREATININE 0.55 04/15/2020   CREATININE 0.42 (L) 01/13/2020   PROT 8.7 (H) 04/15/2020   BILITOT 0.5 04/15/2020    ALT 26 04/15/2020   AST 28 04/15/2020   ALKPHOS 102 04/15/2020   Other Labs:   TVUS: see report Other Imaging: DG Chest 2 View  Result Date: 07/21/2022 CLINICAL DATA:  Shortness of breath EXAM: CHEST - 2 VIEW COMPARISON:  Chest x-ray October 29, 2008 FINDINGS: The cardiomediastinal silhouette is unchanged in contour, allowing for differences in positioning. Low lung volumes with bronchovascular crowding. No focal pulmonary opacity. No pleural effusion or pneumothorax. The visualized upper abdomen is unremarkable. No acute osseous abnormality. IMPRESSION: No active cardiopulmonary disease. Electronically Signed   By: Jacob Moores M.D.   On: 07/21/2022 14:05     Assessment / Plan:   Leah Glover is a 42 y.o. [id number] who presents with symptomatic anemia d/t heavy menstrual bleeding.  1. US shows endometrial thickness of 5 mm with possible fibroid/polyp 2. Aygestin 5 mg BID x 30 days 3. If hemodynamically stable, schedule outpatient follow up in 1-2 weeks for endometrial biopsy if indicated and bleeding management.     Thank you for the opportunity to be involved with Leah Glover's care.  I spent 40 minutes involved in the care of this patient preparing to see the patient by obtaining and reviewing her medical history (including labs, imaging tests and prior procedures), documenting clinical information in the electronic health record (EHR), counseling and coordinating care plans, writing and sending prescriptions, ordering tests or procedures and in direct communicating with the patient and medical staff discussing pertinent items from her history and physical exam.    .

## 2022-07-21 NOTE — ED Notes (Signed)
Pt ambulated to the restroom with a steady gait.

## 2022-07-21 NOTE — Discharge Instructions (Signed)
Please follow-up with OB/GYN as soon as possible.  Return to the emergency department for any further bleeding weakness or any other symptom personally concerning to yourself.

## 2022-07-21 NOTE — ED Notes (Signed)
Consent for blood obtained with video interpretor [phone]

## 2022-07-21 NOTE — ED Provider Notes (Signed)
[facility] Provider Note    Event Date/Time   First MD Initiated Contact with Patient 07/21/22 1408     (approximate)  History   Chief Complaint: Shortness of Breath and Sore Throat  HPI  Leah Glover is a 42 y.o. female with a past history of anemia, menorrhagia, presents to the emergency department for weakness fatigue shortness of breath.  According to the patient for the past month or so she has noted increased weakness and fatigue.  States she feels dehydrated and short of breath especially with exertion.  Patient has a history of requiring blood transfusion previously due to menorrhagia states this was 2 years ago or so.  Patient is supposed to be taking iron supplements but admits she is not currently taking them.  Patient states she was having a heavy menstrual cycle for the first 2 or 3 days but is now light.  Eyes any black or bloody stool.  Physical Exam   Triage Vital Signs: ED Triage Vitals  Enc Vitals Group     BP 07/21/22 1337 110/66     Pulse Rate 07/21/22 1337 64     Resp 07/21/22 1335 18     Temp 07/21/22 1335 98.2 F (36.8 C)     Temp src --      SpO2 07/21/22 1337 100 %     Weight 07/21/22 1339 160 lb (72.6 kg)     Height --      Head Circumference --      Peak Flow --      Pain Score 07/21/22 1337 0     Pain Loc --      Pain Edu? --      Excl. in GC? --     Most recent vital signs: Vitals:   07/21/22 1335 07/21/22 1337  BP:  110/66  Pulse:  64  Resp: 18   Temp: 98.2 F (36.8 C)   SpO2:  100%    General: Awake, no distress.  CV:  Good peripheral perfusion.  Regular rate and rhythm  Resp:  Normal effort.  Equal breath sounds bilaterally.  Abd:  No distention.  Soft, nontender.  No rebound or guarding.  ED Results / Procedures / Treatments   EKG  EKG viewed and interpreted by myself shows sinus bradycardia 58 bpm with a narrow QRS, normal axis, normal intervals, no concerning ST changes.  RADIOLOGY  I have  evaluated and interpreted the x-ray images.  No consolidation seen on my evaluation. Radiology is read the x-ray is negative   MEDICATIONS ORDERED IN ED: Medications  0.9 %  sodium chloride infusion (has no administration in time range)     IMPRESSION / MDM / ASSESSMENT AND PLAN / ED COURSE  I reviewed the triage vital signs and the nursing notes.  Patient's presentation is most consistent with acute presentation with potential threat to life or bodily function.  Patient presents emergency department for 1 month of weakness fatigue shortness of breath with exertion.  Patient's lab work shows significant anemia with a hemoglobin of 6.5.  Patient's MCV of 60 concerning for likely iron deficiency anemia in addition to menorrhagia.  Patient's chemistry is normal.  Chest x-ray is clear.  However given the patient's significant anemia I highly suspect this to be the cause of the patient's fatigue weakness and shortness of breath with exertion.  We will type and screen as well as transfused 2 units of blood I have verbally consented the patient for  blood products with the interpreter.  I will discuss with OB/GYN as the patient does not currently have an OB/GYN provider.  Given the patient's minimal vaginal bleeding currently I suspect the patient could be transfused and possibly discharged home if we can arrange for close follow-up.  Patient is agreeable to this plan as well.  I spoke with OB/GYN and they will be coming down to see the patient they have requested that we get an ultrasound.  OB has seen the patient.  Ultrasound shows 3.4 cm mass could represent fibroid versus endometrial polyp.  Patient is finishing her transfusion.  Awaiting OB recommendations prior to discharge.   CRITICAL CARE Performed by: Minna Antis   Total critical care time: 30 minutes  Critical care time was exclusive of separately billable procedures and treating other patients.  Critical care was necessary to  treat or prevent imminent or life-threatening deterioration.  Critical care was time spent personally by me on the following activities: development of treatment plan with patient and/or surrogate as well as nursing, discussions with consultants, evaluation of patient's response to treatment, examination of patient, obtaining history from patient or surrogate, ordering and performing treatments and interventions, ordering and review of laboratory studies, ordering and review of radiographic studies, pulse oximetry and re-evaluation of patient's condition.   FINAL CLINICAL IMPRESSION(S) / ED DIAGNOSES   Symptomatic anemia    Note:  This document was prepared using Dragon voice recognition software and may include unintentional dictation errors.   Minna Antis, MD 07/21/22 1930

## 2022-07-21 NOTE — ED Provider Notes (Signed)
Assumed care from [facility].  Upon recheck, patient was complaining of persistent dizziness and lightheadedness.  Patient reports that she drove herself to the emergency department and lives alone and does not feel comfortable being discharged.  I did reach out to certified nurse midwife, Guadlupe Spanish who plans to admit patient.   Pia Mau Redan, PA-C 07/21/22 2257    Minna Antis, MD 07/22/22 2257

## 2022-07-21 NOTE — ED Triage Notes (Signed)
Interpreter # [phone]  Pt to ED POV for dry mouth, sore throat, general fatigue for one month. Cbg 110 Also endorses shob with exertion x1 month with "heart racing"

## 2022-07-22 ENCOUNTER — Encounter: Payer: Self-pay | Admitting: Obstetrics

## 2022-07-22 DIAGNOSIS — D500 Iron deficiency anemia secondary to blood loss (chronic): Secondary | ICD-10-CM | POA: Insufficient documentation

## 2022-07-22 DIAGNOSIS — N939 Abnormal uterine and vaginal bleeding, unspecified: Secondary | ICD-10-CM

## 2022-07-22 LAB — CBC WITH DIFFERENTIAL/PLATELET
Abs Immature Granulocytes: 0.01 10*3/uL (ref 0.00–0.07)
Basophils Absolute: 0.1 10*3/uL (ref 0.0–0.1)
Basophils Relative: 1 %
Eosinophils Absolute: 0.3 10*3/uL (ref 0.0–0.5)
Eosinophils Relative: 5 %
HCT: 31.5 % — ABNORMAL LOW (ref 36.0–46.0)
Hemoglobin: 8.7 g/dL — ABNORMAL LOW (ref 12.0–15.0)
Immature Granulocytes: 0 %
Lymphocytes Relative: 48 %
Lymphs Abs: 2.8 10*3/uL (ref 0.7–4.0)
MCH: 18.2 pg — ABNORMAL LOW (ref 26.0–34.0)
MCHC: 27.6 g/dL — ABNORMAL LOW (ref 30.0–36.0)
MCV: 65.8 fL — ABNORMAL LOW (ref 80.0–100.0)
Monocytes Absolute: 0.6 10*3/uL (ref 0.1–1.0)
Monocytes Relative: 10 %
Neutro Abs: 2.1 10*3/uL (ref 1.7–7.7)
Neutrophils Relative %: 36 %
Platelets: 375 10*3/uL (ref 150–400)
RBC: 4.79 MIL/uL (ref 3.87–5.11)
RDW: 27.6 % — ABNORMAL HIGH (ref 11.5–15.5)
WBC: 5.8 10*3/uL (ref 4.0–10.5)
nRBC: 0 % (ref 0.0–0.2)

## 2022-07-22 LAB — CBC
HCT: 31.3 % — ABNORMAL LOW (ref 36.0–46.0)
Hemoglobin: 8.7 g/dL — ABNORMAL LOW (ref 12.0–15.0)
MCH: 18.2 pg — ABNORMAL LOW (ref 26.0–34.0)
MCHC: 27.8 g/dL — ABNORMAL LOW (ref 30.0–36.0)
MCV: 65.5 fL — ABNORMAL LOW (ref 80.0–100.0)
Platelets: 360 10*3/uL (ref 150–400)
RBC: 4.78 MIL/uL (ref 3.87–5.11)
RDW: 27.3 % — ABNORMAL HIGH (ref 11.5–15.5)
WBC: 4.3 10*3/uL (ref 4.0–10.5)
nRBC: 0 % (ref 0.0–0.2)

## 2022-07-22 LAB — BPAM RBC
Blood Product Expiration Date: 202405202359
ISSUE DATE / TIME: 202404131612
Unit Type and Rh: 5100

## 2022-07-22 LAB — HEPATIC FUNCTION PANEL
ALT: 13 U/L (ref 0–44)
AST: 20 U/L (ref 15–41)
Albumin: 4.3 g/dL (ref 3.5–5.0)
Alkaline Phosphatase: 89 U/L (ref 38–126)
Bilirubin, Direct: <0.1 mg/dL (ref 0.0–0.2)
Total Bilirubin: 0.6 mg/dL (ref 0.3–1.2)
Total Protein: 8.6 g/dL — ABNORMAL HIGH (ref 6.5–8.1)

## 2022-07-22 LAB — TYPE AND SCREEN: Unit division: 0

## 2022-07-22 LAB — HEMOGLOBIN A1C
Hgb A1c MFr Bld: 5.6 % (ref 4.8–5.6)
Mean Plasma Glucose: 114.02 mg/dL

## 2022-07-22 MED ORDER — IBUPROFEN 600 MG PO TABS
600.0000 mg | ORAL_TABLET | Freq: Four times a day (QID) | ORAL | Status: DC | PRN
  Administered 2022-07-22 (×2): 600 mg via ORAL
  Filled 2022-07-22 (×2): qty 1

## 2022-07-22 MED ORDER — ONDANSETRON HCL 4 MG/2ML IJ SOLN: 4.0000 mg | Freq: Four times a day (QID) | INTRAMUSCULAR | Status: DC | PRN

## 2022-07-22 MED ORDER — LACTATED RINGERS IV BOLUS
1000.0000 mL | Freq: Once | INTRAVENOUS | Status: AC
  Administered 2022-07-22: 1000 mL via INTRAVENOUS

## 2022-07-22 MED ORDER — ACETAMINOPHEN 500 MG PO TABS
1000.0000 mg | ORAL_TABLET | Freq: Four times a day (QID) | ORAL | Status: DC | PRN
  Administered 2022-07-22: 1000 mg via ORAL
  Filled 2022-07-22: qty 2

## 2022-07-22 MED ORDER — PNEUMOCOCCAL 20-VAL CONJ VACC 0.5 ML IM SUSY
0.5000 mL | PREFILLED_SYRINGE | INTRAMUSCULAR | Status: AC
  Administered 2022-07-22: 0.5 mL via INTRAMUSCULAR
  Filled 2022-07-22 (×2): qty 0.5

## 2022-07-22 MED ORDER — ZOLPIDEM TARTRATE 5 MG PO TABS: 5.0000 mg | ORAL_TABLET | Freq: Every evening | ORAL | Status: DC | PRN

## 2022-07-22 MED ORDER — FERROUS SULFATE 325 (65 FE) MG PO TBEC: 325.0000 mg | DELAYED_RELEASE_TABLET | Freq: Two times a day (BID) | ORAL | 2 refills | Status: AC

## 2022-07-22 MED ORDER — GUAIFENESIN-DM 100-10 MG/5ML PO SYRP
5.0000 mL | ORAL_SOLUTION | ORAL | Status: DC | PRN
  Administered 2022-07-22 (×2): 5 mL via ORAL
  Filled 2022-07-22 (×3): qty 5

## 2022-07-22 MED ORDER — NORETHINDRONE ACETATE 5 MG PO TABS
5.0000 mg | ORAL_TABLET | Freq: Two times a day (BID) | ORAL | Status: DC
  Administered 2022-07-22 (×2): 5 mg via ORAL
  Filled 2022-07-22 (×3): qty 1

## 2022-07-22 MED ORDER — SODIUM CHLORIDE 0.9 % IV SOLN
125.0000 mg | Freq: Once | INTRAVENOUS | Status: AC
  Administered 2022-07-22: 125 mg via INTRAVENOUS
  Filled 2022-07-22: qty 10

## 2022-07-22 MED ORDER — MENTHOL 3 MG MT LOZG
1.0000 | LOZENGE | OROMUCOSAL | Status: DC | PRN
  Administered 2022-07-22: 3 mg via ORAL
  Filled 2022-07-22: qty 9

## 2022-07-22 MED ORDER — LACTATED RINGERS IV SOLN: INTRAVENOUS | Status: DC

## 2022-07-22 NOTE — Discharge Summary (Signed)
Physician Final Progress Note  Patient ID: Leah Glover MRN: [id number] DOB/AGE: July 30, 1980 42 y.o.  Admit date: 07/21/2022 Admitting provider: Hildred Laser, MD Discharge date: 07/22/2022   Admission Diagnoses:  1) intrauterine pregnancy at Unknown  2) abnormal uterine bleeding  Discharge Diagnoses:  Principal Problem:   Abnormal uterine bleeding (AUB) Active Problems:   Blood loss anemia    History of Present Illness: The patient is a 42 y.o. female with acute blood loss anemia. Her hemoglobin levels have fluctuated below normal for the past couple of years. Yesterday she was admitted and found to have hemoglobin of 6.5. She received 2 units transfused blood in the ER. She was still symptomatic and was then sent to gyn unit for iron infusion/continued observation. At time of discharge her symptoms of lightheadedness and dizziness have improved. She is discharged to home with instructions, precautions and Rx for progesterone only hormone therapy.    Past Medical History:  Diagnosis Date   Anemia    Menorrhagia    Oligomenorrhea     History reviewed. No pertinent surgical history.  No current facility-administered medications on file prior to encounter.   No current outpatient medications on file prior to encounter.    No Known Allergies  Social History   Socioeconomic History   Marital status: Single    Spouse name: Not on file   Number of children: Not on file   Years of education: Not on file   Highest education level: Not on file  Occupational History   Not on file  Tobacco Use   Smoking status: Never   Smokeless tobacco: Never  Vaping Use   Vaping Use: Never used  Substance and Sexual Activity   Alcohol use: Never   Drug use: Never   Sexual activity: Not on file  Other Topics Concern   Not on file  Social History Narrative   Not on file   Social Determinants of Health   Financial Resource Strain: Not on file  Food Insecurity: No Food  Insecurity (07/22/2022)   Hunger Vital Sign    Worried About Running Out of Food in the Last Year: Never true    Ran Out of Food in the Last Year: Never true  Transportation Needs: No Transportation Needs (07/22/2022)   PRAPARE - Administrator, Civil Service (Medical): No    Lack of Transportation (Non-Medical): No  Physical Activity: Not on file  Stress: Not on file  Social Connections: Not on file  Intimate Partner Violence: Not At Risk (07/22/2022)   Humiliation, Afraid, Rape, and Kick questionnaire    Fear of Current or Ex-Partner: No    Emotionally Abused: No    Physically Abused: No    Sexually Abused: No    No family history on file.   Review of Systems  Constitutional:  Negative for chills and fever.  HENT:  Negative for congestion, ear discharge, ear pain, hearing loss, sinus pain and sore throat.   Eyes:  Negative for blurred vision and double vision.  Respiratory:  Negative for cough, shortness of breath and wheezing.   Cardiovascular:  Negative for chest pain, palpitations and leg swelling.  Gastrointestinal:  Negative for abdominal pain, blood in stool, constipation, diarrhea, heartburn, melena, nausea and vomiting.  Genitourinary:  Negative for dysuria, flank pain, frequency, hematuria and urgency.  Musculoskeletal:  Negative for back pain, joint pain and myalgias.  Skin:  Negative for itching and rash.  Neurological:  Negative for dizziness, tingling, tremors, sensory change,  speech change, focal weakness, seizures, loss of consciousness, weakness and headaches.  Endo/Heme/Allergies:  Negative for environmental allergies. Does not bruise/bleed easily.  Psychiatric/Behavioral:  Negative for depression, hallucinations, memory loss, substance abuse and suicidal ideas. The patient is not nervous/anxious and does not have insomnia.      Physical Exam: BP 110/65 (BP Location: Right Arm)   Pulse (!) 54   Temp 97.6 F (36.4 C)   Resp 18   Ht 5' (1.524 m)    Wt 69.5 kg   SpO2 100%   BMI 29.92 kg/m   Constitutional: Well nourished, well developed female in no acute distress.  HEENT: normal Skin: Warm and dry.  Cardiovascular: Regular rate and rhythm.   Extremity:  no edema   Respiratory: Clear to auscultation bilateral. Normal respiratory effort Psych: Alert and Oriented x3. No memory deficits. Normal mood and affect.    Consults: None  Significant Findings/ Diagnostic Studies:  H/H stable following blood transfusion/iron infusion  Procedures: none  Hospital Course: The patient was admitted to Labor and Delivery Triage for observation.   Discharge Condition: good  Disposition: Discharge disposition: 01-Home or Self Care  Diet: iron rich diet, increase hydration  Discharge Activity: Activity as tolerated   Allergies as of 07/22/2022   No Known Allergies      Medication List     STOP taking these medications    medroxyPROGESTERone 10 MG tablet Commonly known as: Provera       TAKE these medications    ferrous sulfate 325 (65 FE) MG EC tablet Take 1 tablet (325 mg total) by mouth 2 (two) times daily with a meal.   norethindrone 5 MG tablet Commonly known as: AYGESTIN Take 1 tablet (5 mg total) by mouth 2 (two) times daily. As directed        Follow-up Information     Schedule an appointment as soon as possible for a visit  with Glenetta Borg, CNM.   Specialty: Obstetrics Contact information: [address] [place] [address] [phone]                 Total time spent taking care of this patient: 28 minutes  Signed: [name], CNM  07/22/2022, 5:19 PM

## 2022-07-22 NOTE — H&P (Cosign Needed Addendum)
Leah Glover is an 42 y.o. [phone] who is being admitted for observation for symptomatic hypotension r/t heavy menstrual bleeding.  Pertinent Gynecological History: Menses: flow is excessive with use of 8-10 pads or tampons on heaviest days and usually lasting 7 to 14 days Bleeding: dysfunctional uterine bleeding Contraception: none DES exposure: unknown Blood transfusions:  received 2U PRBC in ED Previous GYN Procedures:  none   Last pap: normal Date: "a few years ago," per pt OB History: G3, P3003   Menstrual History: Menarche age: 51 No LMP recorded. ~07/09/22    Past Medical History:  Diagnosis Date   Anemia    Menorrhagia    Oligomenorrhea     History reviewed. No pertinent surgical history.  No family history on file.  Social History:  reports that she has never smoked. She has never used smokeless tobacco. She reports that she does not drink alcohol and does not use drugs.  Allergies: No Known Allergies  No medications prior to admission.    Review of Systems  Constitutional:  Positive for fatigue.  Respiratory:  Positive for cough.   Gastrointestinal:  Negative for abdominal pain.  Genitourinary:  Positive for vaginal bleeding. Negative for difficulty urinating.  Negative except as noted above.  Blood pressure 114/65, pulse (!) 44, temperature 97.9 F (36.6 C), temperature source Oral, resp. rate 20, height 5' (1.524 m), weight 69.5 kg, SpO2 100 %. Physical Exam  General Appearance:  Well developed, well nourished, no acute distress, alert and oriented x3 HEENT:  Normocephalic atraumatic, extraocular movements intact, moist mucous membranes Cardiovascular:  Normal S1/S2, regular rate and rhythm Pulmonary:  clear to auscultation, no wheezes, rales or rhonchi, symmetric air entry, good air exchange Abdomen:  Bowel sounds present, soft, nontender, nondistended, no abnormal masses, no epigastric pain Skin:  normal coloration and turgor, no rashes, no  suspicious skin lesions noted  Psychiatric:  Normal mood and affect, appropriate, no AH/VH Pelvic:  NEFG, no vulvar masses or lesions, small amount of bright red vaginal bleeding, uterus ~14 weeks size on bimanual exam, mildly tender to palpation  Results for orders placed or performed during the hospital encounter of 07/21/22 (from the past 24 hour(s))  CBG monitoring, ED     Status: Abnormal   Collection Time: 07/21/22  1:39 PM  Result Value Ref Range   Glucose-Capillary 110 (H) 70 - 99 mg/dL  CBC     Status: Abnormal   Collection Time: 07/21/22  1:41 PM  Result Value Ref Range   WBC 5.8 4.0 - 10.5 K/uL   RBC 4.23 3.87 - 5.11 MIL/uL   Hemoglobin 6.5 (L) 12.0 - 15.0 g/dL   HCT 45.4 (L) 09.8 - 11.9 %   MCV 60.0 (L) 80.0 - 100.0 fL   MCH 15.4 (L) 26.0 - 34.0 pg   MCHC 25.6 (L) 30.0 - 36.0 g/dL   RDW 14.7 (H) 82.9 - 56.2 %   Platelets 459 (H) 150 - 400 K/uL   nRBC 0.0 0.0 - 0.2 %  Basic metabolic panel     Status: Abnormal   Collection Time: 07/21/22  1:41 PM  Result Value Ref Range   Sodium 132 (L) 135 - 145 mmol/L   Potassium 3.5 3.5 - 5.1 mmol/L   Chloride 103 98 - 111 mmol/L   CO2 25 22 - 32 mmol/L   Glucose, Bld 118 (H) 70 - 99 mg/dL   BUN 16 6 - 20 mg/dL   Creatinine, Ser 1.30 0.44 - 1.00 mg/dL  Calcium 8.4 (L) 8.9 - 10.3 mg/dL   GFR, Estimated >98 >11 mL/min   Anion gap 4 (L) 5 - 15  hCG, quantitative, pregnancy     Status: None   Collection Time: 07/21/22  1:41 PM  Result Value Ref Range   hCG, Beta Chain, Quant, S <1 <5 mIU/mL  Hemoglobin A1c     Status: None   Collection Time: 07/21/22  1:41 PM  Result Value Ref Range   Hgb A1c MFr Bld 5.6 4.8 - 5.6 %   Mean Plasma Glucose 114.02 mg/dL  Prepare RBC (crossmatch)     Status: None   Collection Time: 07/21/22  2:37 PM  Result Value Ref Range   Order Confirmation      ORDER PROCESSED BY BLOOD BANK Performed at [facility], [address] [place], [place] [address]   Type and screen [facility]     Status: None (Preliminary result)   Collection Time: 07/21/22  2:50 PM  Result Value Ref Range   ABO/RH(D) O POS    Antibody Screen NEG    Sample Expiration 07/24/2022,2359    Unit Number [id number]    Blood Component Type RED CELLS,LR    Unit division 00    Status of Unit ISSUED    Transfusion Status OK TO TRANSFUSE    Crossmatch Result      Compatible Performed at [facility], [address] [place], [place] [address]    Unit Number [id number]    Blood Component Type RED CELLS,LR    Unit division 00    Status of Unit ISSUED    Transfusion Status OK TO TRANSFUSE    Crossmatch Result Compatible   Resp panel by RT-PCR (RSV, Flu A&B, Covid) Anterior Nasal Swab     Status: None   Collection Time: 07/21/22  4:45 PM   Specimen: Anterior Nasal Swab  Result Value Ref Range   SARS Coronavirus 2 by RT PCR NEGATIVE NEGATIVE   Influenza A by PCR NEGATIVE NEGATIVE   Influenza B by PCR NEGATIVE NEGATIVE   Resp Syncytial Virus by PCR NEGATIVE NEGATIVE  CBC with Differential     Status: Abnormal   Collection Time: 07/22/22 12:38 AM  Result Value Ref Range   WBC 5.8 4.0 - 10.5 K/uL   RBC 4.79 3.87 - 5.11 MIL/uL   Hemoglobin 8.7 (L) 12.0 - 15.0 g/dL   HCT 27.2 (L) 53.6 - 64.4 %   MCV 65.8 (L) 80.0 - 100.0 fL   MCH 18.2 (L) 26.0 - 34.0 pg   MCHC 27.6 (L) 30.0 - 36.0 g/dL   RDW 03.4 (H) 74.2 - 59.5 %   Platelets 375 150 - 400 K/uL   nRBC 0.0 0.0 - 0.2 %   Neutrophils Relative % 36 %   Neutro Abs 2.1 1.7 - 7.7 K/uL   Lymphocytes Relative 48 %   Lymphs Abs 2.8 0.7 - 4.0 K/uL   Monocytes Relative 10 %   Monocytes Absolute 0.6 0.1 - 1.0 K/uL   Eosinophils Relative 5 %   Eosinophils Absolute 0.3 0.0 - 0.5 K/uL   Basophils Relative 1 %   Basophils Absolute 0.1 0.0 - 0.1 K/uL   Immature Granulocytes 0 %   Abs Immature Granulocytes 0.01 0.00 - 0.07 K/uL  Hepatic function panel     Status: Abnormal   Collection Time: 07/22/22 12:56 AM  Result Value Ref  Range   Total Protein 8.6 (H) 6.5 - 8.1 g/dL  Albumin 4.3 3.5 - 5.0 g/dL   AST 20 15 - 41 U/L   ALT 13 0 - 44 U/L   Alkaline Phosphatase 89 38 - 126 U/L   Total Bilirubin 0.6 0.3 - 1.2 mg/dL   Bilirubin, Direct <2.1 0.0 - 0.2 mg/dL   Indirect Bilirubin NOT CALCULATED 0.3 - 0.9 mg/dL    US PELVIC COMPLETE WITH TRANSVAGINAL  Result Date: 07/21/2022 CLINICAL DATA:  Vaginal bleeding.  Hemoglobin 6.5. EXAM: TRANSABDOMINAL AND TRANSVAGINAL ULTRASOUND OF PELVIS TECHNIQUE: Both transabdominal and transvaginal ultrasound examinations of the pelvis were performed. Transabdominal technique was performed for global imaging of the pelvis including uterus, ovaries, adnexal regions, and pelvic cul-de-sac. It was necessary to proceed with endovaginal exam following the transabdominal exam to visualize the adnexa. COMPARISON:  pelvic ultrasound 04/15/2020 FINDINGS: Uterus Measurements: 10.9 x 5.9 x 6.6 cm = volume: 222 mL. No fibroids or other mass visualized. Endometrium Thickness: 5 mm. As seen on ultrasound 04/15/2020, along the posterior endometrium there is a heterogenous mildly hypoechoic vascular 3.2 x 2.9 x 3.4 cm mass. This is indeterminate and could represent a sub mucosal fibroid versus endometrial polyp. This previously measured 3.4 x 3.3 x 3.2 cm and is likely unchanged in size given differences in obliquity and measuring technique. Right ovary Measurements: 3.0 x 1.5 x 1.2 cm = volume: 2.9 mL. Normal appearance/no adnexal mass. Left ovary The left ovary was unable to be visualized transabdominally or transvaginally. Other findings No abnormal free fluid. IMPRESSION: Similar size and appearance of heterogenous mildly hypoechoic vascular mass along the posterior endometrium measuring up to 3.4 cm. This is indeterminate and could represent a submucosal fibroid versus endometrial polyp. In the setting of post-menopausal bleeding, endometrial sampling is indicated to exclude carcinoma. If results are benign,  sonohysterogram should be considered for focal lesion work-up prior to hysteroscopy. (Ref: Radiological Reasoning: Algorithmic Workup of Abnormal Vaginal Bleeding with Endovaginal Sonography and Sonohysterography. AJR 2008; 194:R74-08) Electronically Signed   By: Minerva Fester M.D.   On: 07/21/2022 19:11   DG Chest 2 View  Result Date: 07/21/2022 CLINICAL DATA:  Shortness of breath EXAM: CHEST - 2 VIEW COMPARISON:  Chest x-ray October 29, 2008 FINDINGS: The cardiomediastinal silhouette is unchanged in contour, allowing for differences in positioning. Low lung volumes with bronchovascular crowding. No focal pulmonary opacity. No pleural effusion or pneumothorax. The visualized upper abdomen is unremarkable. No acute osseous abnormality. IMPRESSION: No active cardiopulmonary disease. Electronically Signed   By: Jacob Moores M.D.   On: 07/21/2022 14:05    Assessment/Plan: -Hgb increased to 8.7 after 2 units of blood. Jearlean still feeling fatigued. Will give iron infusion and hydrate tonight. CBC in AM. -Beginning a course of Aygestin for heavy bleeding. Will follow up with OB/GYN as an outpatient -Chronic cough - CXR done in ED states no active cardiopulmonary disease. Resp panel negative. F/u with PCP or pulmonology.  Glenetta Borg 07/22/2022, 2:14 AM

## 2022-07-22 NOTE — Progress Notes (Signed)
Electronic interpreter utilized during visit  Subjective: Patient reports ongoing symptoms of fatigue, lightheadedness, dizziness. Quality is the same- not better or worse. She has been able to tolerate a small amount of PO and states she now feels like she has to pee. Update per RN- she assisted patient to bathroom and had 500 output (dark urine).  Objective: Vital Signs: BP 106/66 (BP Location: Right Arm)   Pulse 66   Temp 98.2 F (36.8 C) (Oral)   Resp 18   Ht 5' (1.524 m)   Wt 69.5 kg   SpO2 100%   BMI 29.92 kg/m  Constitutional: Well nourished, well developed female, lethargic appearing.  HEENT: normal Skin: Warm and dry.  Cardiovascular: Regular rate and rhythm.   Respiratory:  Normal respiratory effort Psych: Alert and Oriented x3. No memory deficits. Normal mood and affect.    Latest Reference Range & Units 07/22/22 00:38 07/22/22 07:13  WBC 4.0 - 10.5 K/uL 5.8 4.3  RBC 3.87 - 5.11 MIL/uL 4.79 4.78  Hemoglobin 12.0 - 15.0 g/dL 8.7 (L) 8.7 (L)  HCT 34.1 - 46.0 % 31.5 (L) 31.3 (L)  MCV 80.0 - 100.0 fL 65.8 (L) 65.5 (L)  MCH 26.0 - 34.0 pg 18.2 (L) 18.2 (L)  MCHC 30.0 - 36.0 g/dL 93.7 (L) 90.2 (L)  RDW 11.5 - 15.5 % 27.6 (H) 27.3 (H)  Platelets 150 - 400 K/uL 375 360  nRBC 0.0 - 0.2 % 0.0 0.0  (L): Data is abnormally low (H): Data is abnormally high  Assessment/Plan: 42 y.o. with symptomatic anemia, H/H stable   LOS: 0 days   LR bolus Continue to monitor for s/s anemia Regular diet Disposition: Discharge to home once symptoms improve   Leah Glover, CNM 07/22/2022, 11:46 AM

## 2022-07-23 ENCOUNTER — Telehealth: Payer: Self-pay | Admitting: Obstetrics

## 2022-07-23 NOTE — Telephone Encounter (Signed)
I left message for the patient to call back to scheduled for an office visit in 1-2 weeks for abnormal uterine bleedng. She was seen in the ED 4/13. I was going to offer today with Missy at 2:15 pm due to cancellation for today.

## 2022-07-23 NOTE — Telephone Encounter (Signed)
-----   Message from Glenetta Borg, CNM sent at 07/21/2022  7:52 PM EDT ----- Regarding: ED f/u Hi!  Could you please help Leah Glover get scheduled for an office visit in 1-2 weeks for abnormal uterine bleeding? She was seen in the ED 4/13.  Thank you -  Missy

## 2022-07-24 LAB — THYROID PANEL WITH TSH
Free Thyroxine Index: 2.5 (ref 1.2–4.9)
T3 Uptake Ratio: 28 % (ref 24–39)
T4, Total: 8.9 ug/dL (ref 4.5–12.0)
TSH: 4.55 u[IU]/mL — ABNORMAL HIGH (ref 0.450–4.500)

## 2022-07-24 NOTE — Telephone Encounter (Signed)
I left generic message for the patient to call back to be scheduled.

## 2022-07-30 ENCOUNTER — Telehealth: Payer: Self-pay | Admitting: Obstetrics

## 2022-07-30 NOTE — Telephone Encounter (Signed)
Reached out to pt to schedule an appointment for elevated TSH.  Left message via an interpreter for pt to call back to schedule.

## 2022-08-01 ENCOUNTER — Encounter: Payer: Self-pay | Admitting: Obstetrics

## 2022-08-01 NOTE — Telephone Encounter (Signed)
Letter mailed due to patient not returning call to scheduled follow up appointment

## 2022-08-27 ENCOUNTER — Other Ambulatory Visit: Payer: Self-pay | Admitting: Obstetrics

## 2022-08-27 DIAGNOSIS — N939 Abnormal uterine and vaginal bleeding, unspecified: Secondary | ICD-10-CM

## 2022-08-27 NOTE — Telephone Encounter (Signed)
The patient has contacted the office/ Grecia A was the interpreter for the patient's scheduling.

## 2022-08-27 NOTE — Telephone Encounter (Signed)
I contacted the patient using pacific interpreter  Eddie Candle ID [phone] to leave message for the patient to call back about ultrasound and labs that are scheduled and need to be done prior to patient's appointment scheduled with Gu[place] Spanish. To request interpreter when she calls

## 2022-09-11 ENCOUNTER — Encounter: Payer: Self-pay | Admitting: Certified Nurse Midwife

## 2022-09-14 ENCOUNTER — Other Ambulatory Visit: Payer: Self-pay

## 2022-09-14 ENCOUNTER — Telehealth: Payer: Self-pay | Admitting: Obstetrics

## 2022-09-14 NOTE — Telephone Encounter (Signed)
Reached out to pt to reschedule Gyn [place] and lab appointments that were scheduled for 67/2024 at 2:00 for [place] and 3:00 for labs.  Left message for pt to call back to reschedule.

## 2022-09-17 ENCOUNTER — Encounter: Payer: Self-pay | Admitting: Obstetrics

## 2022-09-17 NOTE — Telephone Encounter (Signed)
Reached out to pt (2x) to reschedule GYN [place] ad lab appts that were scheduled for 09/14/2022 at 2:00 for the [place] and 3:00 for labs.  Spoke with pt.  She did not know when she wanted to reschedule the appts bc she didn't know about her work schedule.  Pt said that she would call back to reschedule.

## 2022-09-24 ENCOUNTER — Encounter: Payer: Self-pay | Admitting: Obstetrics

## 2023-12-18 ENCOUNTER — Emergency Department: Payer: Self-pay

## 2023-12-18 ENCOUNTER — Other Ambulatory Visit: Payer: Self-pay

## 2023-12-18 ENCOUNTER — Emergency Department
Admission: EM | Admit: 2023-12-18 | Discharge: 2023-12-18 | Disposition: A | Discharge status: Home / Self Care | Payer: Self-pay | Attending: Emergency Medicine | Admitting: Emergency Medicine

## 2023-12-18 DIAGNOSIS — R059 Cough, unspecified: Secondary | ICD-10-CM | POA: Insufficient documentation

## 2023-12-18 DIAGNOSIS — D649 Anemia, unspecified: Secondary | ICD-10-CM | POA: Insufficient documentation

## 2023-12-18 LAB — COMPREHENSIVE METABOLIC PANEL WITH GFR
ALT: 12 U/L (ref 0–44)
AST: 16 U/L (ref 15–41)
Albumin: 3.8 g/dL (ref 3.5–5.0)
Alkaline Phosphatase: 80 U/L (ref 38–126)
Anion gap: 7 (ref 5–15)
BUN: 16 mg/dL (ref 6–20)
CO2: 25 mmol/L (ref 22–32)
Calcium: 9.1 mg/dL (ref 8.9–10.3)
Chloride: 106 mmol/L (ref 98–111)
Creatinine, Ser: 0.56 mg/dL (ref 0.44–1.00)
GFR, Estimated: >60 mL/min (ref >60)
Glucose, Bld: 90 mg/dL (ref 70–99)
Potassium: 4.2 mmol/L (ref 3.5–5.1)
Sodium: 138 mmol/L (ref 135–145)
Total Bilirubin: 0.5 mg/dL (ref 0.0–1.2)
Total Protein: 8.2 g/dL — ABNORMAL HIGH (ref 6.5–8.1)

## 2023-12-18 LAB — CBC
HCT: 26.9 % — ABNORMAL LOW (ref 36.0–46.0)
Hemoglobin: 7.3 g/dL — ABNORMAL LOW (ref 12.0–15.0)
MCH: 16.7 pg — ABNORMAL LOW (ref 26.0–34.0)
MCHC: 27.1 g/dL — ABNORMAL LOW (ref 30.0–36.0)
MCV: 61.7 fL — ABNORMAL LOW (ref 80.0–100.0)
Platelets: 380 K/uL (ref 150–400)
RBC: 4.36 MIL/uL (ref 3.87–5.11)
RDW: 21.2 % — ABNORMAL HIGH (ref 11.5–15.5)
WBC: 7.3 K/uL (ref 4.0–10.5)
nRBC: 0 % (ref 0.0–0.2)

## 2023-12-18 LAB — PREPARE RBC (CROSSMATCH)

## 2023-12-18 LAB — RESP PANEL BY RT-PCR (RSV, FLU A&B, COVID)  RVPGX2
Influenza A by PCR: NEGATIVE
Influenza B by PCR: NEGATIVE
Resp Syncytial Virus by PCR: NEGATIVE
SARS Coronavirus 2 by RT PCR: NEGATIVE

## 2023-12-18 LAB — GROUP A STREP BY PCR: Group A Strep by PCR: NOT DETECTED

## 2023-12-18 MED ORDER — DIPHENHYDRAMINE HCL 25 MG PO CAPS
25.0000 mg | ORAL_CAPSULE | Freq: Once | ORAL | Status: AC
  Administered 2023-12-18: 25 mg via ORAL
  Filled 2023-12-18: qty 1

## 2023-12-18 MED ORDER — SODIUM CHLORIDE 0.9 % IV SOLN: Freq: Once | INTRAVENOUS | Status: AC

## 2023-12-18 MED ORDER — SODIUM CHLORIDE 0.9% IV SOLUTION
Freq: Once | INTRAVENOUS | Status: DC
  Filled 2023-12-18: qty 250

## 2023-12-18 NOTE — ED Triage Notes (Addendum)
 Arrives C/O fast heart rate.  Feels SOB. And c/o pain to throat  when swallowing.  Symptoms x 1 week.  States fever and chills as well x 1 week.  States fever last night. Tylenol  taken at 10 pm.  Presents AAOx3. Skin warm and dry. NAD  C/O fast heart rate with walking or coughing. C/O non productive cough.

## 2023-12-18 NOTE — ED Notes (Signed)
 Assisted pt to the bathroom

## 2023-12-18 NOTE — ED Notes (Signed)
 The pt advised she was having some itching and red spots on her right thigh and arm area. Dr. Arlander advised.

## 2023-12-18 NOTE — ED Provider Notes (Signed)
 [facility] Provider Note    Event Date/Time   First MD Initiated Contact with Patient 12/18/23 1304     (approximate)   History   URI  Spanish interpreter used HPI  Leah Glover is a 43 y.o. female with a history of anemia who presents with complaints of dizziness with standing, shortness of breath which is similar to complaints that she has when her her hemoglobin has been low in the past.  Review of records demonstrates the patient has required transfusions before.  She is not currently menstruating.  She also complains of mild sore throat, cough     Physical Exam   Triage Vital Signs: ED Triage Vitals  Encounter Vitals Group     BP 12/18/23 1231 (!) 98/52     Girls Systolic BP Percentile --      Girls Diastolic BP Percentile --      Boys Systolic BP Percentile --      Boys Diastolic BP Percentile --      Pulse Rate 12/18/23 1231 84     Resp 12/18/23 1231 16     Temp 12/18/23 1231 98.4 F (36.9 C)     Temp Source 12/18/23 1231 Oral     SpO2 12/18/23 1231 100 %     Weight 12/18/23 1227 69.5 kg (153 lb 3.5 oz)     Height --      Head Circumference --      Peak Flow --      Pain Score 12/18/23 1227 0     Pain Loc --      Pain Education --      Exclude from Growth Chart --     Most recent vital signs: Vitals:   12/18/23 1231 12/18/23 1625  BP: (!) 98/52 (!) 94/58  Pulse: 84 74  Resp: 16 16  Temp: 98.4 F (36.9 C)   SpO2: 100% 100%     General: Awake, no distress.  CV:  Good peripheral perfusion.  Tachycardia, especially with sitting up or standing Resp:  Normal effort.  Abd:  No distention.  Other:     ED Results / Procedures / Treatments   Labs (all labs ordered are listed, but only abnormal results are displayed) Labs Reviewed  CBC - Abnormal; Notable for the following components:      Result Value   Hemoglobin 7.3 (*)    HCT 26.9 (*)    MCV 61.7 (*)    MCH 16.7 (*)    MCHC 27.1 (*)    RDW 21.2 (*)    All  other components within normal limits  COMPREHENSIVE METABOLIC PANEL WITH GFR - Abnormal; Notable for the following components:   Total Protein 8.2 (*)    All other components within normal limits  RESP PANEL BY RT-PCR (RSV, FLU A&B, COVID)  [id number]  GROUP A STREP BY PCR  PREPARE RBC (CROSSMATCH)  TYPE AND SCREEN     EKG  ED ECG REPORT I, Lamar Price, the attending physician, personally viewed and interpreted this ECG.  Date: 12/18/2023  Rhythm: normal sinus rhythm QRS Axis: normal Intervals: normal ST/T Wave abnormalities: normal Narrative Interpretation: no evidence of acute ischemia    RADIOLOGY Chest x-ray viewed interpreted by me, no pneumonia    PROCEDURES:  Critical Care performed: yes  CRITICAL CARE Performed by: Lamar Price   Total critical care time: 30 minutes  Critical care time was exclusive of separately billable procedures and treating other patients.  Critical  care was necessary to treat or prevent imminent or life-threatening deterioration.  Critical care was time spent personally by me on the following activities: development of treatment plan with patient and/or surrogate as well as nursing, discussions with consultants, evaluation of patient's response to treatment, examination of patient, obtaining history from patient or surrogate, ordering and performing treatments and interventions, ordering and review of laboratory studies, ordering and review of radiographic studies, pulse oximetry and re-evaluation of patient's condition.   Procedures   MEDICATIONS ORDERED IN ED: Medications  0.9 %  sodium chloride  infusion (Manually program via Guardrails IV Fluids) (has no administration in time range)  0.9 %  sodium chloride  infusion (0 mLs Intravenous Stopped 12/18/23 1545)     IMPRESSION / MDM / ASSESSMENT AND PLAN / ED COURSE  I reviewed the triage vital signs and the nursing notes. Patient's presentation is most consistent with severe  exacerbation of chronic illness.  Patient presents with dizziness, shortness of breath as detailed above, she is hypotensive upon arrival heart rate is normal while lying down but increases to 210 with sitting up.  Differential includes pneumonia, upper respiratory infection, anemia  COVID test, strep test negative, chest x-ray without evidence of pneumonia.  Lab work demonstrates hemoglobin of 7.3, her baseline appears to be 8-9  This is likely the cause of her symptoms, given her symptomatic anemia will order a unit of blood, referral to hematology        FINAL CLINICAL IMPRESSION(S) / ED DIAGNOSES   Final diagnoses:  Symptomatic anemia     Rx / DC Orders   ED Discharge Orders     None        Note:  This document was prepared using Dragon voice recognition software and may include unintentional dictation errors.   Arlander [place], MD 12/18/23 [phone]

## 2023-12-19 LAB — TYPE AND SCREEN
ABO/RH(D): O POS
Antibody Screen: NEGATIVE
Unit division: 0

## 2023-12-19 LAB — BPAM RBC
Blood Product Expiration Date: 202510102359
ISSUE DATE / TIME: 202509101824
Unit Type and Rh: 5100

## 2023-12-20 ENCOUNTER — Emergency Department
Admission: EM | Admit: 2023-12-20 | Discharge: 2023-12-20 | Disposition: A | Discharge status: Home / Self Care | Payer: Self-pay | Attending: Emergency Medicine | Admitting: Emergency Medicine

## 2023-12-20 ENCOUNTER — Emergency Department: Payer: Self-pay

## 2023-12-20 DIAGNOSIS — N938 Other specified abnormal uterine and vaginal bleeding: Secondary | ICD-10-CM | POA: Insufficient documentation

## 2023-12-20 DIAGNOSIS — D509 Iron deficiency anemia, unspecified: Secondary | ICD-10-CM | POA: Insufficient documentation

## 2023-12-20 LAB — RESP PANEL BY RT-PCR (RSV, FLU A&B, COVID)  RVPGX2
Influenza A by PCR: NEGATIVE
Influenza B by PCR: NEGATIVE
Resp Syncytial Virus by PCR: NEGATIVE
SARS Coronavirus 2 by RT PCR: NEGATIVE

## 2023-12-20 LAB — CBC WITH DIFFERENTIAL/PLATELET
Abs Immature Granulocytes: 0.02 K/uL (ref 0.00–0.07)
Basophils Absolute: 0.1 K/uL (ref 0.0–0.1)
Basophils Relative: 1 %
Eosinophils Absolute: 0.1 K/uL (ref 0.0–0.5)
Eosinophils Relative: 2 %
HCT: 31.2 % — ABNORMAL LOW (ref 36.0–46.0)
Hemoglobin: 8.4 g/dL — ABNORMAL LOW (ref 12.0–15.0)
Immature Granulocytes: 0 %
Lymphocytes Relative: 34 %
Lymphs Abs: 2.2 K/uL (ref 0.7–4.0)
MCH: 16.5 pg — ABNORMAL LOW (ref 26.0–34.0)
MCHC: 26.9 g/dL — ABNORMAL LOW (ref 30.0–36.0)
MCV: 61.3 fL — ABNORMAL LOW (ref 80.0–100.0)
Monocytes Absolute: 0.6 K/uL (ref 0.1–1.0)
Monocytes Relative: 9 %
Neutro Abs: 3.4 K/uL (ref 1.7–7.7)
Neutrophils Relative %: 54 %
Platelets: 381 K/uL (ref 150–400)
RBC: 5.09 MIL/uL (ref 3.87–5.11)
RDW: 21.3 % — ABNORMAL HIGH (ref 11.5–15.5)
Smear Review: NORMAL
WBC: 6.4 K/uL (ref 4.0–10.5)
nRBC: 0 % (ref 0.0–0.2)

## 2023-12-20 LAB — COMPREHENSIVE METABOLIC PANEL WITH GFR
ALT: 13 U/L (ref 0–44)
AST: 17 U/L (ref 15–41)
Albumin: 4 g/dL (ref 3.5–5.0)
Alkaline Phosphatase: 86 U/L (ref 38–126)
Anion gap: 10 (ref 5–15)
BUN: 15 mg/dL (ref 6–20)
CO2: 24 mmol/L (ref 22–32)
Calcium: 9.1 mg/dL (ref 8.9–10.3)
Chloride: 100 mmol/L (ref 98–111)
Creatinine, Ser: 0.48 mg/dL (ref 0.44–1.00)
GFR, Estimated: >60 mL/min (ref >60)
Glucose, Bld: 96 mg/dL (ref 70–99)
Potassium: 4.1 mmol/L (ref 3.5–5.1)
Sodium: 134 mmol/L — ABNORMAL LOW (ref 135–145)
Total Bilirubin: 0.6 mg/dL (ref 0.0–1.2)
Total Protein: 8.6 g/dL — ABNORMAL HIGH (ref 6.5–8.1)

## 2023-12-20 LAB — HCG, QUANTITATIVE, PREGNANCY: hCG, Beta Chain, Quant, S: <1 m[IU]/mL (ref <5)

## 2023-12-20 LAB — TYPE AND SCREEN
ABO/RH(D): O POS
Antibody Screen: NEGATIVE

## 2023-12-20 LAB — TROPONIN I (HIGH SENSITIVITY): Troponin I (High Sensitivity): 3 ng/L (ref <18)

## 2023-12-20 MED ORDER — SODIUM CHLORIDE 0.9 % IV BOLUS
1000.0000 mL | Freq: Once | INTRAVENOUS | Status: AC
  Administered 2023-12-20: 1000 mL via INTRAVENOUS

## 2023-12-20 NOTE — ED Provider Notes (Signed)
 [facility] Provider Note    Event Date/Time   First MD Initiated Contact with Patient 12/20/23 1300     (approximate)   History   SOB   HPI  Leah Glover is a 43 y.o. female with history of anemia who comes in with concerns for shortness of breath, weakness.  Patient was seen here 2 days ago on 9/10.  Noted to have some low blood pressures and tachycardic.  Her hemoglobin was 7.3 and she was ordered 1 unit of blood with a plan for referral to hematology.  I reviewed a note where patient was seen on 07/21/2022 for abnormal bleeding causing anemia.  Received 2 units of blood but given she was still symptomatic she was sent to the [facility] unit for iron infusion and observation and was discharged on progesterone only.  She did have an ultrasound at that time that showed a submucosal fibroid versus endometrial polyp  Patient reports that she was told to come into the emergency room for admission for hysterectomy.  According to the note from her primary care doctor it stated that patient would need to be admitted for the hysterectomy to help get her blood levels up prior to hysterectomy.  She denies any current bleeding now but she does report having very heavy periods with large blood clots and having 2 periods a month.  She does report her last bleeding was about a few days ago.  She reports some shortness of breath and weakness a little bit of chest discomfort secondary to the symptoms.  She denies any abdominal pain.   Physical Exam   Triage Vital Signs: ED Triage Vitals  Encounter Vitals Group     BP 12/20/23 1254 (!) 89/49     Girls Systolic BP Percentile --      Girls Diastolic BP Percentile --      Boys Systolic BP Percentile --      Boys Diastolic BP Percentile --      Pulse Rate 12/20/23 1254 65     Resp 12/20/23 1254 16     Temp 12/20/23 1254 98.4 F (36.9 C)     Temp Source 12/20/23 1254 Oral     SpO2 12/20/23 1254 98 %     Weight --       Height --      Head Circumference --      Peak Flow --      Pain Score 12/20/23 1253 0     Pain Loc --      Pain Education --      Exclude from Growth Chart --     Most recent vital signs: Vitals:   12/20/23 1254  BP: (!) 89/49  Pulse: 65  Resp: 16  Temp: 98.4 F (36.9 C)  SpO2: 98%     General: Awake, no distress.  CV:  Good peripheral perfusion.  Resp:  Normal effort.  Abd:  No distention.  Soft and nontender Other:  No swelling in legs no calf tenderness    ED Results / Procedures / Treatments   Labs (all labs ordered are listed, but only abnormal results are displayed) Labs Reviewed  CBC WITH DIFFERENTIAL/PLATELET  COMPREHENSIVE METABOLIC PANEL WITH GFR  TYPE AND SCREEN     EKG  My interpretation of EKG:  Sinus rate of 55 without any ST elevation or T wave versions, normal intervals  RADIOLOGY   PROCEDURES:  Critical Care performed: No  .1-3 Lead EKG Interpretation  Performed  by: Ernest Ronal BRAVO, MD Authorized by: Ernest Ronal BRAVO, MD     Interpretation: normal     ECG rate:  50   ECG rate assessment: normal     Rhythm: sinus rhythm     Ectopy: none     Conduction: normal      MEDICATIONS ORDERED IN ED: Medications - No data to display   IMPRESSION / MDM / ASSESSMENT AND PLAN / ED COURSE  I reviewed the triage vital signs and the nursing notes.   Patient's presentation is most consistent with acute presentation with potential threat to life or bodily function.   Patient comes in with concerns for symptomatic anemia.  She was sent in for admission for hysterectomy but patient is not have any active bleeding.  Initial blood pressure was slightly low but without intervention repeat blood pressure is reassuring.  COVID, flu are negative.  CBC shows hemoglobin appropriately responded to the blood transfusion 2 days ago now up to 8.4 which is closer to her baseline.  Her CMP is reassuring.  Her troponin is negative hCG is negative.  Discussed the  case with Lolita Loots from OB/GYN who will come down to evaluate patient and discuss further with her.  But unfortunately they are not able to admit patient for a hysterectomy if there is no active hemorrhaging and her hemoglobin is stable.  She will need to have this dealt with outpatient.  I did send a message to Dr. Melanee to help get her in with the hematology clinic to help with her iron deficient anemia.  However at this time her blood pressures are stable she has no active bleeding and will be cleared for discharge home after OB/GYN discusses further with her and helps get an outpatient plan in place.  She was instructed to return if she develops the return of bleeding as we can recheck her hemoglobin then and evaluate for a drop.   Spanish interpretor used.   The patient is on the cardiac monitor to evaluate for evidence of arrhythmia and/or significant heart rate changes.      FINAL CLINICAL IMPRESSION(S) / ED DIAGNOSES   Final diagnoses:  Iron deficiency anemia, unspecified iron deficiency anemia type  Dysfunctional uterine bleeding     Rx / DC Orders   ED Discharge Orders     None        Note:  This document was prepared using Dragon voice recognition software and may include unintentional dictation errors.   Ernest Ronal BRAVO, MD 12/20/23 [phone]

## 2023-12-20 NOTE — ED Notes (Signed)
 Pt to Xray

## 2023-12-20 NOTE — ED Notes (Signed)
 This RN introduced self to patient and updated patient on discharge status. This RN informed the patient that she is waiting to speak to the nurse midwife before being discharge to discuss plan moving forward. Patient verbalized understanding. Patient denies other needs at this time.

## 2023-12-20 NOTE — ED Triage Notes (Addendum)
 C/O fatigue and SOB for days. By PCP yesterday for anemia and was told to go to ED today if any bleeding occurs to ask for admission for preparation for hysterectomy.  Patient denies current vaginal bleeding.  AAOx3.  Skin warm and dry. NAD  Last Hbg 7.8 from 12/17/2023

## 2023-12-20 NOTE — Discharge Instructions (Signed)
 Please call OBGYN to make an appointment to discuss hysterectomy. Hematology should call to follow up for anemia.  Return to ER for worsening shortness of breath, return of bleeding or any other concerns.   Llame a su gineclogo/a para programar una cita y hablar sobre la histerectoma.  El departamento de hematologa debe llamar para realizar un seguimiento por anemia.  Regrese a urgencias si la dificultad para respirar empeora, el sangrado regresa o cualquier otra inquietud.

## 2023-12-24 ENCOUNTER — Inpatient Hospital Stay: Payer: Self-pay

## 2023-12-24 ENCOUNTER — Inpatient Hospital Stay: Payer: MEDICAID | Admitting: Nurse Practitioner

## 2023-12-24 NOTE — Progress Notes (Deleted)
 [facility] at [facility] A Department of the [place]. [facility] [address], [address] [place], [place] [address] [phone] (phone) [phone] (fax)  Clinic Day:  12/24/2023  Referring physician: [facility]  Chief Complaints: Anemia  History of Presenting Illness: Leah Glover is a 43 y.o. female who presents for evaluation of anemia.  On 12/18/2023 she presented to [facility] ER with complaints of dizziness, shortness of breath that was similar to when she had anemia in the past.  Blood pressure was soft.  Tachycardic.  Hemoglobin was found to be 7.3.  And she received 1 unit of PRBCs.  She endorses heavy menstrual periods with large blood clots approximately 2 weeks a month.  She was discharged with hemoglobin of 8.4.  She has had some discussions with gynecology regarding a hysterectomy.  Patient has past history of anemia and was seen on 07/21/2022.  She received 2 units of blood as well as an iron infusion and was discharged on progestin.  Ultrasound at that time showed a submucosal fibroid versus endometrial polyp.  Review of Systems - Oncology   Past Medical History:  Diagnosis Date   Anemia    Menorrhagia    Oligomenorrhea     No past surgical history on file.  No family history on file.   reports that she has never smoked. She has never used smokeless tobacco. She reports that she does not drink alcohol and does not use drugs.  No Known Allergies  Current Outpatient Medications  Medication Sig Dispense Refill   Ferrous Sulfate  (IRON) 325 (65 Fe) MG TABS Take by mouth.     medroxyPROGESTERone  (PROVERA ) 10 MG tablet Take by mouth.     naproxen (NAPROSYN) 500 MG tablet Take 500 mg by mouth 2 (two) times daily.     ferrous sulfate  325 (65 FE) MG EC tablet Take 1 tablet (325 mg total) by mouth 2 (two) times daily with a meal. 60 tablet 2   norethindrone  (AYGESTIN ) 5 MG tablet Take 1 tablet (5 mg total) by mouth 2  (two) times daily. As directed 60 tablet 0   No current facility-administered medications for this visit.   Objective:  Last menstrual period 12/18/2023.  Wt Readings from Last 3 Encounters:  12/18/23 153 lb 3.5 oz (69.5 kg)  07/22/22 153 lb 3.2 oz (69.5 kg)  04/15/20 160 lb (72.6 kg)    There is no height or weight on file to calculate BMI.  Performance status (ECOG): {CHL ONC [id number] Physical Exam     Latest Ref Rng & Units 12/20/2023   12:57 PM 12/18/2023    2:19 PM 07/22/2022    7:13 AM  CBC  WBC 4.0 - 10.5 K/uL 6.4  7.3  4.3   Hemoglobin 12.0 - 15.0 g/dL 8.4  7.3  8.7   Hematocrit 36.0 - 46.0 % 31.2  26.9  31.3   Platelets 150 - 400 K/uL 381  380  360       Latest Ref Rng & Units 12/20/2023   12:57 PM 12/18/2023    2:19 PM 07/22/2022   12:56 AM  CMP  Glucose 70 - 99 mg/dL 96  90    BUN 6 - 20 mg/dL 15  16    Creatinine 9.55 - 1.00 mg/dL 9.51  9.43    Sodium 864 - 145 mmol/L 134  138    Potassium 3.5 - 5.1 mmol/L 4.1  4.2    Chloride 98 - 111  mmol/L 100  106    CO2 22 - 32 mmol/L 24  25    Calcium 8.9 - 10.3 mg/dL 9.1  9.1    Total Protein 6.5 - 8.1 g/dL 8.6  8.2  8.6   Total Bilirubin 0.0 - 1.2 mg/dL 0.6  0.5  0.6   Alkaline Phos 38 - 126 U/L 86  80  89   AST 15 - 41 U/L 17  16  20    ALT 0 - 44 U/L 13  12  13       Assessment & Plan:  Leah Glover is a 43 y.o. female ***     I discussed the assessment and treatment plan with the patient.  The patient was provided an opportunity to ask questions and all were answered.  The patient agreed with the plan and demonstrated an understanding of the instructions.  The patient was advised to call back if the symptoms worsen or if the condition fails to improve as anticipated.  Thank you for the opportunity to participate in the care of this very pleasant patient  Tinnie Dawn, DNP, AGNP-C [facility] at [facility] A Department of the [facility]. [facility] [phone]  (clinic)

## 2024-01-02 ENCOUNTER — Encounter: Payer: Self-pay | Admitting: Emergency Medicine
